# Patient Record
Sex: Female | Born: 1990 | Race: Black or African American | Hispanic: No | Marital: Single | Smoking: Current every day smoker
Health system: Southern US, Community
[De-identification: ages and names within clinical notes are randomized; demographics above are authoritative.]

## PROBLEM LIST (undated history)

## (undated) ENCOUNTER — Inpatient Hospital Stay (HOSPITAL_COMMUNITY): Payer: Medicaid Other

## (undated) ENCOUNTER — Inpatient Hospital Stay (HOSPITAL_COMMUNITY): Payer: Self-pay

## (undated) DIAGNOSIS — B9689 Other specified bacterial agents as the cause of diseases classified elsewhere: Secondary | ICD-10-CM

## (undated) DIAGNOSIS — A749 Chlamydial infection, unspecified: Secondary | ICD-10-CM

## (undated) DIAGNOSIS — J45909 Unspecified asthma, uncomplicated: Secondary | ICD-10-CM

## (undated) DIAGNOSIS — J40 Bronchitis, not specified as acute or chronic: Secondary | ICD-10-CM

## (undated) DIAGNOSIS — N760 Acute vaginitis: Secondary | ICD-10-CM

## (undated) HISTORY — PX: OTHER SURGICAL HISTORY: SHX169

## (undated) HISTORY — PX: WISDOM TOOTH EXTRACTION: SHX21

---
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
## 2012-02-25 HISTORY — PX: WISDOM TOOTH EXTRACTION: SHX21

## 2013-01-03 ENCOUNTER — Encounter (HOSPITAL_COMMUNITY): Payer: Self-pay | Admitting: Emergency Medicine

## 2013-01-03 ENCOUNTER — Emergency Department (INDEPENDENT_AMBULATORY_CARE_PROVIDER_SITE_OTHER)
Admission: EM | Admit: 2013-01-03 | Discharge: 2013-01-03 | Disposition: A | Discharge status: Home / Self Care | Payer: Medicaid Other | Source: Home / Self Care | Attending: Family Medicine | Admitting: Family Medicine

## 2013-01-03 DIAGNOSIS — R590 Localized enlarged lymph nodes: Secondary | ICD-10-CM

## 2013-01-03 DIAGNOSIS — Z331 Pregnant state, incidental: Secondary | ICD-10-CM

## 2013-01-03 DIAGNOSIS — R599 Enlarged lymph nodes, unspecified: Secondary | ICD-10-CM

## 2013-01-03 DIAGNOSIS — Z3490 Encounter for supervision of normal pregnancy, unspecified, unspecified trimester: Secondary | ICD-10-CM

## 2013-01-03 LAB — POCT URINALYSIS DIP (DEVICE)
Bilirubin Urine: NEGATIVE
Glucose, UA: NEGATIVE mg/dL
Hgb urine dipstick: NEGATIVE
Ketones, ur: NEGATIVE mg/dL
Nitrite: NEGATIVE
Protein, ur: NEGATIVE mg/dL
Specific Gravity, Urine: >=1.03 (ref 1.005–1.030)
Urobilinogen, UA: 0.2 mg/dL (ref 0.0–1.0)
pH: 6 (ref 5.0–8.0)

## 2013-01-03 LAB — POCT PREGNANCY, URINE: Preg Test, Ur: POSITIVE — AB

## 2013-01-03 NOTE — ED Notes (Signed)
C/o bladder infection and abscess on vagina Patient states the bladder infection was noticed a couple of days ago No treatment done  Noticed abscess today but feels if she takes antibiotic it will clear

## 2013-01-03 NOTE — ED Provider Notes (Signed)
CSN: [id number]     Arrival date & time 01/03/13  1915 History   First MD Initiated Contact with Patient 01/03/13 2017     Chief Complaint  Patient presents with  . Cystitis  . Abscess   (Consider location/radiation/quality/duration/timing/severity/associated sxs/prior Treatment) Patient is a 22 y.o. female presenting with abscess. The history is provided by the patient.  Abscess Location:  Ano-genital Ano-genital abscess location:  Groin Abscess quality: painful   Abscess quality: not draining, no fluctuance, no induration and no itching   Red streaking: no   Duration:  5 days Pain details:    Quality:  Dull Chronicity:  New Context comment:  Told at clinic last week that she was preg.   History reviewed. No pertinent past medical history. No past surgical history on file. No family history on file. History  Substance Use Topics  . Smoking status: Not on file  . Smokeless tobacco: Not on file  . Alcohol Use: Not on file   OB History   Grav Para Term Preterm Abortions TAB SAB Ect Mult Living                 Review of Systems  Constitutional: Negative.   Genitourinary: Positive for pelvic pain. Negative for dysuria, urgency, frequency and menstrual problem.    Allergies  Review of patient's allergies indicates no known allergies.  Home Medications  No current outpatient prescriptions on file. BP 120/70  Pulse 111  Temp(Src) 99.3 F (37.4 C) (Oral)  Resp 16  SpO2 100%  LMP 12/15/2012 Physical Exam  Nursing note and vitals reviewed. Constitutional: She is oriented to person, place, and time. She appears well-developed and well-nourished.  Abdominal: Soft. Bowel sounds are normal.  Musculoskeletal: She exhibits tenderness.  Left inguinal nodes tender, no skin lesions or abscess.  Neurological: She is alert and oriented to person, place, and time.  Skin: Skin is warm and dry.    ED Course  Procedures (including critical care time) Labs Review Labs  Reviewed  POCT URINALYSIS DIP (DEVICE) - Abnormal; Notable for the following:    Leukocytes, UA TRACE (*)    All other components within normal limits  POCT PREGNANCY, URINE - Abnormal; Notable for the following:    Preg Test, Ur POSITIVE (*)    All other components within normal limits   Imaging Review No results found.  EKG Interpretation     Ventricular Rate:    PR Interval:    QRS Duration:   QT Interval:    QTC Calculation:   R Axis:     Text Interpretation:              MDM  upreg pos.    Linna Hoff, MD 01/04/13 [phone]

## 2013-11-08 ENCOUNTER — Inpatient Hospital Stay (HOSPITAL_COMMUNITY)
Admission: AD | Admit: 2013-11-08 | Discharge: 2013-11-08 | Disposition: A | Discharge status: Home / Self Care | Payer: No Typology Code available for payment source | Source: Ambulatory Visit | Attending: Obstetrics | Admitting: Obstetrics

## 2013-11-08 ENCOUNTER — Encounter (HOSPITAL_COMMUNITY): Payer: Self-pay

## 2013-11-08 DIAGNOSIS — O209 Hemorrhage in early pregnancy, unspecified: Secondary | ICD-10-CM | POA: Diagnosis not present

## 2013-11-08 DIAGNOSIS — O26851 Spotting complicating pregnancy, first trimester: Secondary | ICD-10-CM

## 2013-11-08 DIAGNOSIS — N898 Other specified noninflammatory disorders of vagina: Secondary | ICD-10-CM | POA: Insufficient documentation

## 2013-11-08 DIAGNOSIS — R1031 Right lower quadrant pain: Secondary | ICD-10-CM | POA: Diagnosis not present

## 2013-11-08 DIAGNOSIS — Z87891 Personal history of nicotine dependence: Secondary | ICD-10-CM | POA: Insufficient documentation

## 2013-11-08 DIAGNOSIS — O26891 Other specified pregnancy related conditions, first trimester: Secondary | ICD-10-CM

## 2013-11-08 HISTORY — DX: Unspecified asthma, uncomplicated: J45.909

## 2013-11-08 LAB — OB RESULTS CONSOLE GC/CHLAMYDIA
Chlamydia: NEGATIVE
Gonorrhea: NEGATIVE

## 2013-11-08 LAB — URINALYSIS, ROUTINE W REFLEX MICROSCOPIC
Bilirubin Urine: NEGATIVE
Glucose, UA: 250 mg/dL — AB
Hgb urine dipstick: NEGATIVE
Ketones, ur: NEGATIVE mg/dL
Leukocytes, UA: NEGATIVE
Nitrite: NEGATIVE
Protein, ur: NEGATIVE mg/dL
Specific Gravity, Urine: 1.025 (ref 1.005–1.030)
Urobilinogen, UA: 0.2 mg/dL (ref 0.0–1.0)
pH: 6 (ref 5.0–8.0)

## 2013-11-08 LAB — WET PREP, GENITAL
Trich, Wet Prep: NONE SEEN
WBC, Wet Prep HPF POC: NONE SEEN
Yeast Wet Prep HPF POC: NONE SEEN

## 2013-11-08 MED ORDER — ALBUTEROL SULFATE HFA 108 (90 BASE) MCG/ACT IN AERS: 2.0000 | INHALATION_SPRAY | Freq: Four times a day (QID) | RESPIRATORY_TRACT | Status: DC | PRN

## 2013-11-08 MED ORDER — PROMETHAZINE HCL 12.5 MG PO TABS: 12.5000 mg | ORAL_TABLET | Freq: Four times a day (QID) | ORAL | Status: DC | PRN

## 2013-11-08 NOTE — Discharge Instructions (Signed)
Call the office [phone] to schedule your next prenatal care appointment or if your symptoms worsen. Vaginal Bleeding During Pregnancy, First Trimester A small amount of bleeding (spotting) from the vagina is relatively common in early pregnancy. It usually stops on its own. Various things may cause bleeding or spotting in early pregnancy. Some bleeding may be related to the pregnancy, and some may not. In most cases, the bleeding is normal and is not a problem. However, bleeding can also be a sign of something serious. Be sure to tell your health care provider about any vaginal bleeding right away. Some possible causes of vaginal bleeding during the first trimester include:  Infection or inflammation of the cervix.  Growths (polyps) on the cervix.  Miscarriage or threatened miscarriage.  Pregnancy tissue has developed outside of the uterus and in a fallopian tube (tubal pregnancy).  Tiny cysts have developed in the uterus instead of pregnancy tissue (molar pregnancy). HOME CARE INSTRUCTIONS  Watch your condition for any changes. The following actions may help to lessen any discomfort you are feeling:  Follow your health care provider's instructions for limiting your activity. If your health care provider orders bed rest, you may need to stay in bed and only get up to use the bathroom. However, your health care provider may allow you to continue light activity.  If needed, make plans for someone to help with your regular activities and responsibilities while you are on bed rest.  Keep track of the number of pads you use each day, how often you change pads, and how soaked (saturated) they are. Write this down.  Do not use tampons. Do not douche.  Do not have sexual intercourse or orgasms until approved by your health care provider.  If you pass any tissue from your vagina, save the tissue so you can show it to your health care provider.  Only take over-the-counter or prescription  medicines as directed by your health care provider.  Do not take aspirin because it can make you bleed.  Keep all follow-up appointments as directed by your health care provider. SEEK MEDICAL CARE IF:  You have any vaginal bleeding during any part of your pregnancy.  You have cramps or labor pains.  You have a fever, not controlled by medicine. SEEK IMMEDIATE MEDICAL CARE IF:   You have severe cramps in your back or belly (abdomen).  You pass large clots or tissue from your vagina.  Your bleeding increases.  You feel light-headed or weak, or you have fainting episodes.  You have chills.  You are leaking fluid or have a gush of fluid from your vagina.  You pass out while having a bowel movement. MAKE SURE YOU:  Understand these instructions.  Will watch your condition.  Will get help right away if you are not doing well or get worse. Document Released: 11/20/2004 Document Revised: 02/15/2013 Document Reviewed: 10/18/2012 [facility] Patient Information 2015 [place], [place]. This information is not intended to replace advice given to you by your health care provider. Make sure you discuss any questions you have with your health care provider. Subchorionic Hematoma A subchorionic hematoma is a gathering of blood between the outer wall of the placenta and the inner wall of the womb (uterus). The placenta is the organ that connects the fetus to the wall of the uterus. The placenta performs the feeding, breathing (oxygen to the fetus), and waste removal (excretory work) of the fetus.  Subchorionic hematoma is the most common abnormality found on a result  from ultrasonography done during the first trimester or early second trimester of pregnancy. If there has been little or no vaginal bleeding, early small hematomas usually shrink on their own and do not affect your baby or pregnancy. The blood is gradually absorbed over 1-2 weeks. When bleeding starts later in pregnancy or the hematoma  is larger or occurs in an older pregnant woman, the outcome may not be as good. Larger hematomas may get bigger, which increases the chances for miscarriage. Subchorionic hematoma also increases the risk of premature detachment of the placenta from the uterus, preterm (premature) labor, and stillbirth. HOME CARE INSTRUCTIONS  Stay on bed rest if your health care provider recommends this. Although bed rest will not prevent more bleeding or prevent a miscarriage, your health care provider may recommend bed rest until you are advised otherwise.  Avoid heavy lifting (more than 10 lb [4.5 kg]), exercise, sexual intercourse, or douching as directed by your health care provider.  Keep track of the number of pads you use each day and how soaked (saturated) they are. Write down this information.  Do not use tampons.  Keep all follow-up appointments as directed by your health care provider. Your health care provider may ask you to have follow-up blood tests or ultrasound tests or both. SEEK IMMEDIATE MEDICAL CARE IF:  You have severe cramps in your stomach, back, abdomen, or pelvis.  You have a fever.  You pass large clots or tissue. Save any tissue for your health care provider to look at.  Your bleeding increases or you become lightheaded, feel weak, or have fainting episodes. Document Released: 05/28/2006 Document Revised: 06/27/2013 Document Reviewed: 09/09/2012 [facility] Patient Information 2015 [place], [place]. This information is not intended to replace advice given to you by your health care provider. Make sure you discuss any questions you have with your health care provider.

## 2013-11-08 NOTE — MAU Provider Note (Signed)
History     CSN: [id number]  Arrival date and time: 11/08/13 1812 Provider notified: 1920 Provider on unit: 1940 Provider at bedside: 2000     Chief Complaint  Patient presents with  . Abdominal Pain  . Vaginal Discharge   Abdominal Pain Associated symptoms include nausea.  Vaginal Discharge Associated symptoms include nausea.    [name] is a 23 yo G3P0020 female at 10.[redacted] wks gestation presenting with complaints of post coital vaginal spotting x 3 days. She became concerned this evening, because it continued to go on.  She reports a burning sensation in the RLQ. She has a [facility] that measured 1.6 x 1.4 x 0.4 cm with a posterior placenta by sono at [facility] on 8/17. She has not had her first prenatal visit with [facility]. Her primary OB provider is Dr. Seymour Wagner.  Past Medical History  Diagnosis Date  . Asthma     Past Surgical History  Procedure Laterality Date  . Wisdom tooth extraction  2014  . No past surgeries      No family history on file.  History  Substance Use Topics  . Smoking status: Former Smoker -- 0.25 packs/day for 5 years  . Smokeless tobacco: Not on file  . Alcohol Use: No    Allergies:  Allergies  Allergen Reactions  . Shellfish Allergy Hives and Swelling    Prescriptions prior to admission  Medication Sig Dispense Refill  . Prenatal Vit-Fe Fumarate-FA (PRENATAL MULTIVITAMIN) TABS tablet Take 1 tablet by mouth daily at 12 noon.        Review of Systems  Constitutional: Negative.   HENT: Negative.   Eyes: Negative.   Respiratory:       Chest tightness  Cardiovascular: Negative.   Gastrointestinal: Positive for nausea.       RLQ "burning sensation"  Genitourinary:       Dark brown spotting x 3 days  Musculoskeletal: Negative.   Skin: Negative.   Neurological: Negative.   Endo/Heme/Allergies: Negative.   Psychiatric/Behavioral: Negative.     Results for orders placed during the hospital encounter of 11/08/13 (from the past 24  hour(s))  URINALYSIS, ROUTINE W REFLEX MICROSCOPIC     Status: Abnormal   Collection Time    11/08/13  6:42 PM      Result Value Ref Range   Color, Urine YELLOW  YELLOW   APPearance CLEAR  CLEAR   Specific Gravity, Urine 1.025  1.005 - 1.030   pH 6.0  5.0 - 8.0   Glucose, UA 250 (*) NEGATIVE mg/dL   Hgb urine dipstick NEGATIVE  NEGATIVE   Bilirubin Urine NEGATIVE  NEGATIVE   Ketones, ur NEGATIVE  NEGATIVE mg/dL   Protein, ur NEGATIVE  NEGATIVE mg/dL   Urobilinogen, UA 0.2  0.0 - 1.0 mg/dL   Nitrite NEGATIVE  NEGATIVE   Leukocytes, UA NEGATIVE  NEGATIVE  WET PREP, GENITAL     Status: Abnormal   Collection Time    11/08/13  8:05 PM      Result Value Ref Range   Yeast Wet Prep HPF POC NONE SEEN  NONE SEEN   Trich, Wet Prep NONE SEEN  NONE SEEN   Clue Cells Wet Prep HPF POC FEW (*) NONE SEEN   WBC, Wet Prep HPF POC NONE SEEN  NONE SEEN   FHTs: 167 bpm (doppler)  Physical Exam   Blood pressure 132/83, pulse 68, temperature 98.7 F (37.1 C), temperature source Oral, resp. rate 16, height  (1.6 m), weight  60.691 kg (133 lb 12.8 oz), last menstrual period 12/15/2012, SpO2 98.00%, SpO2 #2 99% on RA  Physical Exam  Constitutional: She is oriented to person, place, and time. She appears well-developed and well-nourished.  HENT:  Head: Normocephalic and atraumatic.  Eyes: Pupils are equal, round, and reactive to light.  Neck: Normal range of motion. Neck supple.  Cardiovascular: Normal rate, regular rhythm and normal heart sounds.   Respiratory: Effort normal and breath sounds normal.  GI: Soft. Bowel sounds are normal.  Genitourinary: Vaginal discharge found.  Small amount of white vaginal d/c; no evidence of blood in vagina; wet prep done; S=D; no CMT  Musculoskeletal: Normal range of motion.  Neurological: She is alert and oriented to person, place, and time.  Skin: Skin is warm and dry.  tattoos  Psychiatric: She has a normal mood and affect. Her behavior is normal.  Judgment and thought content normal.    MAU Course  Procedures SSE Wet Prep GC/CT - pending Assessment and Plan  22 yo G3P0020 @ 10.[redacted] wks gestation Bleeding during pregnancy, first trimester  Discharge home Call to schedule appointment ASAP Call the office for any worsening of sx's  Briana Wagner, M MSN, CNM 11/08/2013, 8:11 PM

## 2013-11-08 NOTE — MAU Note (Signed)
Patient states she has had abdominal pain and spotting for about 3 days after intercourse. Vomited x 1 yesterday. Slight nausea, has med at home.

## 2013-11-09 ENCOUNTER — Encounter (HOSPITAL_COMMUNITY): Payer: Self-pay | Admitting: *Deleted

## 2013-11-09 ENCOUNTER — Inpatient Hospital Stay (HOSPITAL_COMMUNITY)
Admission: AD | Admit: 2013-11-09 | Discharge: 2013-11-09 | Disposition: A | Discharge status: Home / Self Care | Payer: No Typology Code available for payment source | Source: Ambulatory Visit | Attending: Obstetrics | Admitting: Obstetrics

## 2013-11-09 DIAGNOSIS — O360990 Maternal care for other rhesus isoimmunization, unspecified trimester, not applicable or unspecified: Secondary | ICD-10-CM | POA: Insufficient documentation

## 2013-11-09 DIAGNOSIS — O468X1 Other antepartum hemorrhage, first trimester: Secondary | ICD-10-CM

## 2013-11-09 DIAGNOSIS — Z87891 Personal history of nicotine dependence: Secondary | ICD-10-CM | POA: Insufficient documentation

## 2013-11-09 DIAGNOSIS — O208 Other hemorrhage in early pregnancy: Secondary | ICD-10-CM | POA: Diagnosis present

## 2013-11-09 DIAGNOSIS — O418X10 Other specified disorders of amniotic fluid and membranes, first trimester, not applicable or unspecified: Secondary | ICD-10-CM | POA: Diagnosis present

## 2013-11-09 LAB — GC/CHLAMYDIA PROBE AMP
CT Probe RNA: NEGATIVE
GC Probe RNA: NEGATIVE

## 2013-11-09 NOTE — Discharge Instructions (Signed)
Vaginal Bleeding During Pregnancy, First Trimester  A small amount of bleeding (spotting) from the vagina is relatively common in early pregnancy. It usually stops on its own. Various things may cause bleeding or spotting in early pregnancy. Some bleeding may be related to the pregnancy, and some may not. In most cases, the bleeding is normal and is not a problem. However, bleeding can also be a sign of something serious. Be sure to tell your health care provider about any vaginal bleeding right away.  Some possible causes of vaginal bleeding during the first trimester include:  · Infection or inflammation of the cervix.  · Growths (polyps) on the cervix.  · Miscarriage or threatened miscarriage.  · Pregnancy tissue has developed outside of the uterus and in a fallopian tube (tubal pregnancy).  · Tiny cysts have developed in the uterus instead of pregnancy tissue (molar pregnancy).  HOME CARE INSTRUCTIONS   Watch your condition for any changes. The following actions may help to lessen any discomfort you are feeling:  · Follow your health care provider's instructions for limiting your activity. If your health care provider orders bed rest, you may need to stay in bed and only get up to use the bathroom. However, your health care provider may allow you to continue light activity.  · If needed, make plans for someone to help with your regular activities and responsibilities while you are on bed rest.  · Keep track of the number of pads you use each day, how often you change pads, and how soaked (saturated) they are. Write this down.  · Do not use tampons. Do not douche.  · Do not have sexual intercourse or orgasms until approved by your health care provider.  · If you pass any tissue from your vagina, save the tissue so you can show it to your health care provider.  · Only take over-the-counter or prescription medicines as directed by your health care provider.  · Do not take aspirin because it can make you  bleed.  · Keep all follow-up appointments as directed by your health care provider.  SEEK MEDICAL CARE IF:  · You have any vaginal bleeding during any part of your pregnancy.  · You have cramps or labor pains.  · You have a fever, not controlled by medicine.  SEEK IMMEDIATE MEDICAL CARE IF:   · You have severe cramps in your back or belly (abdomen).  · You pass large clots or tissue from your vagina.  · Your bleeding increases.  · You feel light-headed or weak, or you have fainting episodes.  · You have chills.  · You are leaking fluid or have a gush of fluid from your vagina.  · You pass out while having a bowel movement.  MAKE SURE YOU:  · Understand these instructions.  · Will watch your condition.  · Will get help right away if you are not doing well or get worse.  Document Released: 11/20/2004 Document Revised: 02/15/2013 Document Reviewed: 10/18/2012  [employer]® Patient Information ©2015 [employer]. This information is not intended to replace advice given to you by your health care provider. Make sure you discuss any questions you have with your health care provider.

## 2013-11-09 NOTE — MAU Note (Signed)
Pt states that she would like more information on her dx.  Pt's only complaint is a slight burning sensation in her lower abdomen.

## 2013-11-09 NOTE — MAU Note (Signed)
Not having pain, not having any bleeding. Pt's father had called in, did not understand why she was d/c'd with a blood clot. (subchorionic hemorrhage); unable to discuss, did not call primary.  States is having a little burning.

## 2013-11-09 NOTE — MAU Provider Note (Signed)
  History     CSN: [id number]  Arrival date and time: 11/09/13 1230 Provider notified of pt arrival  Provider at bedside    Chief Complaint  Patient presents with  . pt had questions regarding dx    HPI Comments: G3P0020  by early sono presents with questions regarding previous dx of [facility]. States she "wants more information about what it is". Was seen last night in MAU for postcoital bleeding. No VB or pain today. [facility] seen on sono at [redacted]w[redacted]d measuring 1.6x1.4x0.4 cm. Planning to initiate [facility] with Dr. Seymour Bars, but has current financial barriers.    OB History   Grav Para Term Preterm Abortions TAB SAB Ect Mult Living   3    2           Past Medical History  Diagnosis Date  . Asthma     Past Surgical History  Procedure Laterality Date  . Wisdom tooth extraction  2014  . No past surgeries      History reviewed. No pertinent family history.  History  Substance Use Topics  . Smoking status: Former Smoker -- 0.25 packs/day for 5 years  . Smokeless tobacco: Not on file  . Alcohol Use: No    Allergies:  Allergies  Allergen Reactions  . Shellfish Allergy Hives and Swelling    Prescriptions prior to admission  Medication Sig Dispense Refill  . Prenatal Vit-Fe Fumarate-FA (PRENATAL MULTIVITAMIN) TABS tablet Take 1 tablet by mouth daily at 12 noon.      Marland Kitchen albuterol (PROVENTIL HFA;VENTOLIN HFA) 108 (90 BASE) MCG/ACT inhaler Inhale 2 puffs into the lungs every 6 (six) hours as needed for wheezing or shortness of breath.  1 Inhaler  2  . promethazine (PHENERGAN) 12.5 MG tablet Take 1 tablet (12.5 mg total) by mouth every 6 (six) hours as needed for nausea or vomiting.  30 tablet  0    Review of Systems  Constitutional: Negative.   HENT: Negative.   Eyes: Negative.   Respiratory: Negative.   Cardiovascular: Negative.   Gastrointestinal: Negative.   Genitourinary: Negative.   Musculoskeletal: Negative.   Skin: Negative.   Neurological: Negative.    Endo/Heme/Allergies: Negative.   Psychiatric/Behavioral: Negative.    Physical Exam   Blood pressure 120/71, pulse 81, temperature 98.6 F (37 C), temperature source Oral, resp. rate 16, last menstrual period 12/15/2012.  Physical Exam  Constitutional: She is oriented to person, place, and time. She appears well-developed and well-nourished.  HENT:  Head: Normocephalic.  Neck: Normal range of motion.  Cardiovascular: Normal rate.   Respiratory: Effort normal.  Musculoskeletal: Normal range of motion.  Neurological: She is alert and oriented to person, place, and time.  Skin: Skin is warm and dry.  Psychiatric: She has a normal mood and affect.  FHR: 165 bpm  MAU Course  Procedures  Extensive discussion regarding SCH-risks, course of care, warning signs.  Assessment and Plan  [redacted] weeks gestation Subchorionic hemorrhage Rh positive  Discharge home SAB precautions Bleeding precautions Follow-up in office w/Dr. Steffanie Rainwater appointments to begin prenatal care asap -discussed importance d/t of timing of prenatal testing/eval Continue PNV Call office for questions/concerns- after hrs contact info discussed    Briana Wagner, Briana Wagner, Briana Wagner 11/09/2013, 1:27 PM

## 2013-11-10 NOTE — MAU Provider Note (Signed)
WOB chart reviewed. Rh positive

## 2013-12-26 ENCOUNTER — Encounter (HOSPITAL_COMMUNITY): Payer: Self-pay | Admitting: *Deleted

## 2014-01-03 LAB — OB RESULTS CONSOLE RUBELLA ANTIBODY, IGM: Rubella: IMMUNE

## 2014-01-03 LAB — OB RESULTS CONSOLE RPR: RPR: NONREACTIVE

## 2014-01-03 LAB — OB RESULTS CONSOLE HEPATITIS B SURFACE ANTIGEN: Hepatitis B Surface Ag: NEGATIVE

## 2014-01-03 LAB — OB RESULTS CONSOLE HIV ANTIBODY (ROUTINE TESTING): HIV: NONREACTIVE

## 2014-02-03 ENCOUNTER — Emergency Department (HOSPITAL_COMMUNITY)
Admission: EM | Admit: 2014-02-03 | Discharge: 2014-02-03 | Disposition: A | Discharge status: Home / Self Care | Payer: No Typology Code available for payment source | Attending: Emergency Medicine | Admitting: Emergency Medicine

## 2014-02-03 ENCOUNTER — Encounter (HOSPITAL_COMMUNITY): Payer: Self-pay | Admitting: Emergency Medicine

## 2014-02-03 DIAGNOSIS — Y92410 Unspecified street and highway as the place of occurrence of the external cause: Secondary | ICD-10-CM | POA: Diagnosis not present

## 2014-02-03 DIAGNOSIS — Z3490 Encounter for supervision of normal pregnancy, unspecified, unspecified trimester: Secondary | ICD-10-CM

## 2014-02-03 DIAGNOSIS — Z87891 Personal history of nicotine dependence: Secondary | ICD-10-CM | POA: Insufficient documentation

## 2014-02-03 DIAGNOSIS — Y9389 Activity, other specified: Secondary | ICD-10-CM | POA: Diagnosis not present

## 2014-02-03 DIAGNOSIS — O9A212 Injury, poisoning and certain other consequences of external causes complicating pregnancy, second trimester: Secondary | ICD-10-CM | POA: Insufficient documentation

## 2014-02-03 DIAGNOSIS — Z3A20 20 weeks gestation of pregnancy: Secondary | ICD-10-CM | POA: Diagnosis not present

## 2014-02-03 DIAGNOSIS — J45909 Unspecified asthma, uncomplicated: Secondary | ICD-10-CM | POA: Diagnosis not present

## 2014-02-03 DIAGNOSIS — Y998 Other external cause status: Secondary | ICD-10-CM | POA: Insufficient documentation

## 2014-02-03 DIAGNOSIS — O99512 Diseases of the respiratory system complicating pregnancy, second trimester: Secondary | ICD-10-CM | POA: Insufficient documentation

## 2014-02-03 DIAGNOSIS — S3992XA Unspecified injury of lower back, initial encounter: Secondary | ICD-10-CM | POA: Insufficient documentation

## 2014-02-03 DIAGNOSIS — Z79899 Other long term (current) drug therapy: Secondary | ICD-10-CM | POA: Diagnosis not present

## 2014-02-03 DIAGNOSIS — S199XXA Unspecified injury of neck, initial encounter: Secondary | ICD-10-CM | POA: Insufficient documentation

## 2014-02-03 MED ORDER — HYDROCODONE-ACETAMINOPHEN 5-325 MG PO TABS: 1.0000 | ORAL_TABLET | Freq: Four times a day (QID) | ORAL | Status: DC | PRN
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
## 2014-02-03 NOTE — Discharge Instructions (Signed)
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
## 2014-02-24 NOTE — L&D Delivery Note (Signed)
Delivery Note At 4:25 PM a viable unspecified sex was delivered via Vaginal, Spontaneous Delivery (Presentation: Right Occiput Posterior).  APGAR: , ; weight  .   Placenta status: Intact, Pathology Spontaneous.  Cord: 3 vessels with the following complications: None.   Anesthesia: Epidural  Episiotomy: None Lacerations: 1st degree;Perineal Suture Repair: 2.0 vicryl Est. Blood Loss (mL): 250  Mom to postpartum.  Baby to Couplet care / Skin to Skin.  Briana Wagner A 04/14/2014, 4:35 PM

## 2014-04-02 ENCOUNTER — Encounter (HOSPITAL_COMMUNITY): Payer: Self-pay | Admitting: *Deleted

## 2014-04-02 ENCOUNTER — Inpatient Hospital Stay (HOSPITAL_COMMUNITY): Payer: Medicaid Other

## 2014-04-02 ENCOUNTER — Inpatient Hospital Stay (HOSPITAL_COMMUNITY)
Admission: AD | Admit: 2014-04-02 | Discharge: 2014-04-07 | DRG: 782 | Disposition: A | Discharge status: Home / Self Care | Payer: Medicaid Other | Source: Ambulatory Visit | Attending: Obstetrics | Admitting: Obstetrics

## 2014-04-02 DIAGNOSIS — O4690 Antepartum hemorrhage, unspecified, unspecified trimester: Secondary | ICD-10-CM | POA: Diagnosis present

## 2014-04-02 DIAGNOSIS — Z3A31 31 weeks gestation of pregnancy: Secondary | ICD-10-CM | POA: Diagnosis present

## 2014-04-02 DIAGNOSIS — Z87891 Personal history of nicotine dependence: Secondary | ICD-10-CM | POA: Diagnosis not present

## 2014-04-02 DIAGNOSIS — Z8249 Family history of ischemic heart disease and other diseases of the circulatory system: Secondary | ICD-10-CM

## 2014-04-02 DIAGNOSIS — O4693 Antepartum hemorrhage, unspecified, third trimester: Principal | ICD-10-CM | POA: Diagnosis present

## 2014-04-02 DIAGNOSIS — O4590 Premature separation of placenta, unspecified, unspecified trimester: Secondary | ICD-10-CM | POA: Diagnosis present

## 2014-04-02 HISTORY — DX: Bronchitis, not specified as acute or chronic: J40

## 2014-04-02 LAB — URINE MICROSCOPIC-ADD ON

## 2014-04-02 LAB — CBC
HCT: 35.7 % — ABNORMAL LOW (ref 36.0–46.0)
Hemoglobin: 12.6 g/dL (ref 12.0–15.0)
MCH: 32.1 pg (ref 26.0–34.0)
MCHC: 35.3 g/dL (ref 30.0–36.0)
MCV: 91.1 fL (ref 78.0–100.0)
Platelets: 147 10*3/uL — ABNORMAL LOW (ref 150–400)
RBC: 3.92 MIL/uL (ref 3.87–5.11)
RDW: 12.9 % (ref 11.5–15.5)
WBC: 8.4 10*3/uL (ref 4.0–10.5)

## 2014-04-02 LAB — URINALYSIS, ROUTINE W REFLEX MICROSCOPIC
Bilirubin Urine: NEGATIVE
Glucose, UA: NEGATIVE mg/dL
Ketones, ur: NEGATIVE mg/dL
Nitrite: NEGATIVE
Protein, ur: NEGATIVE mg/dL
Specific Gravity, Urine: 1.015 (ref 1.005–1.030)
Urobilinogen, UA: 0.2 mg/dL (ref 0.0–1.0)
pH: 6 (ref 5.0–8.0)

## 2014-04-02 LAB — PROTIME-INR
INR: 0.98 (ref 0.00–1.49)
Prothrombin Time: 13.1 seconds (ref 11.6–15.2)

## 2014-04-02 LAB — FIBRINOGEN: Fibrinogen: 488 mg/dL — ABNORMAL HIGH (ref 204–475)

## 2014-04-02 LAB — ABO/RH: ABO/RH(D): O POS

## 2014-04-02 LAB — TYPE AND SCREEN
ABO/RH(D): O POS
Antibody Screen: NEGATIVE

## 2014-04-02 LAB — APTT: aPTT: 27 seconds (ref 24–37)

## 2014-04-02 MED ORDER — OXYCODONE-ACETAMINOPHEN 5-325 MG PO TABS
1.0000 | ORAL_TABLET | Freq: Once | ORAL | Status: AC
Start: 2014-04-02 — End: 2014-04-02
  Administered 2014-04-02: 1 via ORAL
  Filled 2014-04-02: qty 1

## 2014-04-02 MED ORDER — PROMETHAZINE HCL 25 MG PO TABS
25.0000 mg | ORAL_TABLET | Freq: Once | ORAL | Status: AC
  Administered 2014-04-02: 25 mg via ORAL
  Filled 2014-04-02: qty 1

## 2014-04-02 MED ORDER — ZOLPIDEM TARTRATE 5 MG PO TABS
5.0000 mg | ORAL_TABLET | Freq: Every evening | ORAL | Status: DC | PRN
Start: 2014-04-02 — End: 2014-04-07
  Administered 2014-04-02 – 2014-04-06 (×5): 5 mg via ORAL
  Filled 2014-04-02 (×5): qty 1

## 2014-04-02 MED ORDER — LACTATED RINGERS IV SOLN
INTRAVENOUS | Status: DC
  Administered 2014-04-02 (×2): via INTRAVENOUS
  Administered 2014-04-02: 125 mL/h via INTRAVENOUS
  Administered 2014-04-02: 18:00:00 via INTRAVENOUS
  Administered 2014-04-03: 125 mL/h via INTRAVENOUS
  Administered 2014-04-03: 03:00:00 via INTRAVENOUS

## 2014-04-02 MED ORDER — BETAMETHASONE SOD PHOS & ACET 6 (3-3) MG/ML IJ SUSP
12.0000 mg | INTRAMUSCULAR | Status: AC
Start: 2014-04-02 — End: 2014-04-03
  Administered 2014-04-02 – 2014-04-03 (×2): 12 mg via INTRAMUSCULAR
  Filled 2014-04-02 (×2): qty 2

## 2014-04-02 NOTE — Progress Notes (Signed)
[facility] OK to bring pt to [facility]

## 2014-04-02 NOTE — MAU Provider Note (Signed)
History     CSN: [id number]  Arrival date and time: 04/02/14 0159   First Provider Initiated Contact with Patient 04/02/14 0242      No chief complaint on file.  HPI Comments: Briana Wagner 24 y.o. G2P0010 [redacted]w[redacted]d presents to MAU with vaginal bleeding since last night. She has been passing large clots as well. She states they have been as large as grapefruit. She has mild cramping and would like something for pain. She has not had sexual intercourse in the last 24 hours. She was seen in office last week and was having " brown blood" at that time. She was told this was normal.      Past Medical History  Diagnosis Date  . Asthma   . Bronchitis     Past Surgical History  Procedure Laterality Date  . Wisdom tooth extraction  2014  . No past surgeries    . Wisdom tooth extraction      Family History  Problem Relation Age of Onset  . Hypertension Mother   . Heart disease Father   . Aneurysm Mother     History  Substance Use Topics  . Smoking status: Former Smoker -- 0.25 packs/day for 5 years  . Smokeless tobacco: Not on file  . Alcohol Use: No    Allergies:  Allergies  Allergen Reactions  . Shellfish Allergy Hives and Swelling    Prescriptions prior to admission  Medication Sig Dispense Refill Last Dose  . Prenatal Vit-Fe Fumarate-FA (PRENATAL MULTIVITAMIN) TABS tablet Take 1 tablet by mouth daily at 12 noon.   04/01/2014 at Unknown time  . albuterol (PROVENTIL HFA;VENTOLIN HFA) 108 (90 BASE) MCG/ACT inhaler Inhale 2 puffs into the lungs every 6 (six) hours as needed for wheezing or shortness of breath. 1 Inhaler 2 unk  . HYDROcodone-acetaminophen (NORCO/VICODIN) 5-325 MG per tablet Take 1 tablet by mouth every 6 (six) hours as needed. 6 tablet 0   . promethazine (PHENERGAN) 12.5 MG tablet Take 1 tablet (12.5 mg total) by mouth every 6 (six) hours as needed for nausea or vomiting. 30 tablet 0 unk    Review of Systems  Constitutional: Negative.   HENT: Negative.    Eyes: Negative.   Respiratory: Negative.   Cardiovascular: Negative.   Gastrointestinal: Negative.   Genitourinary:       Vaginal bleeding and cramping  Musculoskeletal: Negative.   Skin: Negative.   Neurological: Negative.   Psychiatric/Behavioral: Negative.    Physical Exam   Blood pressure 125/69, pulse 78, temperature 98.9 F (37.2 C), height  (1.6 m), weight 73.936 kg (163 lb), last menstrual period 12/15/2012.  Physical Exam  Constitutional: She is oriented to person, place, and time. She appears well-developed and well-nourished. No distress.  HENT:  Head: Normocephalic and atraumatic.  GI: Soft. She exhibits no distension. There is tenderness.  Genitourinary:  Genital:External bloody Vaginal: small amount blood in vaginal Biman exam not done   Musculoskeletal: Normal range of motion.  Neurological: She is alert and oriented to person, place, and time.  Skin: Skin is warm.  Psychiatric: She has a normal mood and affect. Her behavior is normal. Judgment and thought content normal.   Results for orders placed or performed during the hospital encounter of 04/02/14 (from the past 24 hour(s))  Urinalysis, Routine w reflex microscopic     Status: Abnormal   Collection Time: 04/02/14  2:12 AM  Result Value Ref Range   Color, Urine YELLOW YELLOW   APPearance CLEAR CLEAR  Specific Gravity, Urine 1.015 1.005 - 1.030   pH 6.0 5.0 - 8.0   Glucose, UA NEGATIVE NEGATIVE mg/dL   Hgb urine dipstick LARGE (A) NEGATIVE   Bilirubin Urine NEGATIVE NEGATIVE   Ketones, ur NEGATIVE NEGATIVE mg/dL   Protein, ur NEGATIVE NEGATIVE mg/dL   Urobilinogen, UA 0.2 0.0 - 1.0 mg/dL   Nitrite NEGATIVE NEGATIVE   Leukocytes, UA TRACE (A) NEGATIVE  Urine microscopic-add on     Status: Abnormal   Collection Time: 04/02/14  2:12 AM  Result Value Ref Range   Squamous Epithelial / LPF FEW (A) RARE   WBC, UA 0-2 <3 WBC/hpf   RBC / HPF 11-20 <3 RBC/hpf   Bacteria, UA FEW (A) RARE  CBC      Status: Abnormal   Collection Time: 04/02/14  4:10 AM  Result Value Ref Range   WBC 8.4 4.0 - 10.5 K/uL   RBC 3.92 3.87 - 5.11 MIL/uL   Hemoglobin 12.6 12.0 - 15.0 g/dL   HCT 45.435.7 (L) 09.836.0 - 11.946.0 %   MCV 91.1 78.0 - 100.0 fL   MCH 32.1 26.0 - 34.0 pg   MCHC 35.3 30.0 - 36.0 g/dL   RDW 14.712.9 82.911.5 - 56.215.5 %   Platelets 147 (L) 150 - 400 K/uL  APTT     Status: None   Collection Time: 04/02/14  4:10 AM  Result Value Ref Range   aPTT 27 24 - 37 seconds   . MAU Course  Procedures  MDM Percocet/ phenergan OB ultrasound/ no evidence abruption Dr Gaynell FaceMarshall will see patient  Ultrasound shows   Assessment and Plan  A: Vaginal bleeding in pregnancy P: Dr Gaynell FaceMarshall will admit   Carolynn ServeBarefoot, Larenda Reedy Miller 04/02/2014, 4:43 AM

## 2014-04-02 NOTE — Progress Notes (Signed)
US adjusted, fhr not tracing well. ? decel but pt turned to side, sitting hight fowlers eating dinner. Appears to not be tracing fhr accurately. When adjusted, fhr 135. Pt states she will eat in a position where fhr can trace more consistently.

## 2014-04-02 NOTE — H&P (Signed)
This is Dr. Francoise CeoBernard Marshall dictating the history and physical on  Briana Wagner  she's a [age] year old gravida 2 para 0010 [facility] 2031 [facility] the patient states that on Friday night she had some painless vaginal bleeding and did not have sex  bleeding stopped on its  Own  tonight at 1 AM she went to the bathroom and saw clot and some  Blood    and came to the hospital she denied any pain ultrasound done to here tonight was normal no sign of a previa abruption her cervix is closed bleeding stop and her uterus was soft was decided patient be admitted for betamethasone 12 mg IM now and in 24 hours continuous monitoring and MFM consult she denied having sex   Past medical history negative Past surgical history negative Social history negative System review negative Physical exam well-developed female in no distress HEENT negative Lungs clear to P&A Heart regular rhythm no murmurs no gallops Breasts negative Abdomen 30 week size soft normal fetal heart and the cervix is closed extremities negative

## 2014-04-02 NOTE — Plan of Care (Signed)
Problem: Consults Goal: Birthing Suites Patient Information Press F2 to bring up selections list Outcome: Completed/Met Date Met:  04/02/14  Pt < [redacted] weeks EGA and Antenatal Patient (< 37 weeks)

## 2014-04-02 NOTE — Progress Notes (Signed)
UR completed 

## 2014-04-02 NOTE — Progress Notes (Signed)
EFM strip reviewed

## 2014-04-02 NOTE — Progress Notes (Signed)
Dr Vincenza HewsQuinn updated pt here at [facility]. Dr Gaynell FaceMarshall consulted-pt to get consult Monday here. Dr Gaynell FaceMarshall notified pt doing well, scant brown bleeding-pt may eat regular diet at lunch if she continues to do well.

## 2014-04-02 NOTE — Progress Notes (Signed)
1752 dr Gaynell FaceMarshall returned page-updated on fhr vbls noted 1 this am, 1at 1430, 1 at 1730 down to 60 x 50 seconds. Interventions including position change, iv bolus, pulse ox placed on mom. Also ? 4-5 minute decel x 2 today but could have been tracing maternal due to pt had been moving and sitting up eating and also fhr was not continuous at both times. No new orders given-to continue monitoring. Updated bleeding scant and brown.

## 2014-04-02 NOTE — Progress Notes (Signed)
Dr Gaynell FaceMarshall in to see pt

## 2014-04-02 NOTE — Progress Notes (Signed)
To us via wc.

## 2014-04-02 NOTE — MAU Note (Addendum)
Pt states had kiwi sized clot last night and then this morning grapefruit sized clot. Mild cramping. No intercourse last 24hrs. Good FM. States does not have bleeding inbetween times of passing clots. Awoke this morning and felt like had to go to BR and then passed large clot

## 2014-04-03 ENCOUNTER — Ambulatory Visit (HOSPITAL_COMMUNITY): Payer: Medicaid Other

## 2014-04-03 LAB — OB RESULTS CONSOLE GBS: GBS: POSITIVE
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
## 2014-04-03 NOTE — Progress Notes (Signed)
Patient ID: Briana Wagner, female   DOB: [birth year], 24 y.o.   MRN: [id number] Vital signs normal Since admission she has not had any bleeding or contractions She is to get her second Otha betamethasone this a.m. and the then the seen by maternal-fetal medicine

## 2014-04-03 NOTE — Plan of Care (Signed)
Problem: Phase I Progression Outcomes Goal: No significant worsening bleeding/cervix change/vag drainage No significant worsening in vaginal bleeding, cervical change, or vaginal drainage.  Outcome: Progressing Pt having small amounts of brown blood with pad change.  Dr. Gaynell FaceMarshall aware. No plans for ultrasound today.

## 2014-04-03 NOTE — Consult Note (Signed)
Maternal Fetal Medicine Consultation  Requesting Provider(s): Francoise Ceo, MD  Reason for consultation: Third trimester vaginal bleeding  HPI: Briana Wagner is a 24 yo G2P0010 currently at 31w 5d seen for consultation due to third trimester vaginal bleeding.  Briana Wagner reports that she experienced heavy, bright red vaginal bleeding yesterday - reports passing "grapefruit-sized clots" and bleeding more than enough to saturate a pad.  Since admission, the bleeding has improved - mostly brownish discharge, but does not some blood after using the restroom.  She initially had some cramping that has since improved.  The fetus is active.  She denies uterine contractions or vaginal bleeding.  Briana Wagner completed her second dose of betamethasone this morning.  Her prenatal course has otherwise been unremarkable.  Admission fibrinogen was 488.  Coags were normal.  Blood type O+.  Limited ultrasound performed at the time of admission did not show previa or ultrasound evidence of abruption.  OB History: OB History    Gravida Para Term Preterm AB TAB SAB Ectopic Multiple Living   2    1     0      PMH:  Past Medical History  Diagnosis Date  . Asthma   . Bronchitis     PSH:  Past Surgical History  Procedure Laterality Date  . Wisdom tooth extraction  2014  . No past surgeries    . Wisdom tooth extraction     Meds:  Scheduled Meds:  Continuous Infusions: . lactated ringers Stopped (04/03/14 1129)   PRN Meds:.zolpidem   Allergies:  Allergies  Allergen Reactions  . Shellfish Allergy Hives and Swelling   FH:  Family History  Problem Relation Age of Onset  . Hypertension Mother   . Heart disease Father   . Aneurysm Mother    Soc:  History   Social History  . Marital Status: Single    Spouse Name: N/A    Number of Children: N/A  . Years of Education: N/A   Occupational History  . Not on file.   Social History Main Topics  . Smoking status: Former Smoker -- 0.25  packs/day for 5 years    Quit date: 09/30/2013  . Smokeless tobacco: Not on file  . Alcohol Use: No  . Drug Use: Yes    Special: Marijuana     Comment: last use 1 month   . Sexual Activity: Yes   Other Topics Concern  . Not on file   Social History Narrative    Review of Systems: no vaginal bleeding or cramping/contractions, no LOF, no nausea/vomiting. All other systems reviewed and are negative.  PNL:   PE:   Filed Vitals:   04/03/14 1131  BP: 111/61  Pulse: 71  Temp:   Resp:     Labs: CBC    Component Value Date/Time   WBC 8.4 04/02/2014 0410   RBC 3.92 04/02/2014 0410   HGB 12.6 04/02/2014 0410   HCT 35.7* 04/02/2014 0410   PLT 147* 04/02/2014 0410   MCV 91.1 04/02/2014 0410   MCH 32.1 04/02/2014 0410   MCHC 35.3 04/02/2014 0410   RDW 12.9 04/02/2014 0410     A/P: 1) Single IUP at 31w 5d         2) Third trimester vaginal bleeding - normal fibrinogen, normal coags - no evidence of abruption or previa by admission ultrasound.  Currently stable - without active bleeding currently  Recommendations: 1) Recommend ultrasound for EFW while hospitalized.  Given that this is her  second episode of vaginal bleeding, would also recommend serial growth scans every 3-4 weeks and antenatal testing (2x weekly NSTs with weekly AFIs) after hospital discharge. 2) Would continue in-house observation for now - would observe in house for at least 72 hours without any active vaginal bleeding prior to hospital discharge.   3) At least daily or more frequent NSTs as clinically appropriate while hospitalized.  Recommendations discussed with Dr. Gaynell FaceMarshall   Thank you for the opportunity to be a part of the care of Loletha GrayerMelanie D Fooks. Please contact our office if we can be of further assistance.   I spent approximately 30 minutes with this patient with over 50% of time spent in face-to-face counseling.  Alpha GulaPaul Lamonica Trueba, MD Maternal Fetal Medicine

## 2014-04-04 MED ORDER — PRENATAL MULTIVITAMIN CH
1.0000 | ORAL_TABLET | Freq: Every day | ORAL | Status: DC
  Administered 2014-04-04 – 2014-04-06 (×3): 1 via ORAL
  Filled 2014-04-04 (×3): qty 1

## 2014-04-04 MED ORDER — DOCUSATE SODIUM 100 MG PO CAPS
100.0000 mg | ORAL_CAPSULE | Freq: Two times a day (BID) | ORAL | Status: DC
  Administered 2014-04-04 – 2014-04-06 (×6): 100 mg via ORAL
  Filled 2014-04-04 (×5): qty 1

## 2014-04-04 MED ORDER — DOCUSATE SODIUM 50 MG/5ML PO LIQD
100.0000 mg | Freq: Two times a day (BID) | ORAL | Status: DC
  Filled 2014-04-04 (×3): qty 10

## 2014-04-04 MED ORDER — INFLUENZA VAC SPLIT QUAD 0.5 ML IM SUSY
0.5000 mL | PREFILLED_SYRINGE | INTRAMUSCULAR | Status: AC
  Administered 2014-04-05: 0.5 mL via INTRAMUSCULAR
  Filled 2014-04-04 (×2): qty 0.5

## 2014-04-04 NOTE — Progress Notes (Signed)
Patient ID: Loletha GrayerMelanie D Edgerly, female   DOB: March 26, 1990, 24 y.o.   MRN: [id number] Vital signs normal No bleeding or cramping today Patient says she had a small amount of bright red bleeding yesterday she was seen by MFM and and the impression is a marginal abruption and so she will be kept on bedrest and discharge  After  5 days from the hospital

## 2014-04-05 DIAGNOSIS — O4590 Premature separation of placenta, unspecified, unspecified trimester: Secondary | ICD-10-CM | POA: Diagnosis present

## 2014-04-05 NOTE — Progress Notes (Signed)
Ur chart review completed.  

## 2014-04-05 NOTE — Progress Notes (Signed)
Patient ID: Briana Wagner, female   DOB: 12/27/1990, 24 y.o.   MRN: [id number] Vital signs normal Reactive tracing She has not had any more bleeding or contractions

## 2014-04-06 NOTE — Progress Notes (Signed)
Pt c/o breasts leaking milk- breast pads given to pt.

## 2014-04-06 NOTE — Progress Notes (Signed)
Patient ID: Briana Wagner, female   DOB: 22-May-1990, 22 y.o.   MRN: [id number] Vital signs normal No bleeding No cramping home tomorrow

## 2014-04-07 NOTE — Discharge Instructions (Signed)
Discharge instructions   You can wash your hair  Shower  Eat what you want  Drink what you want  See me in 6 weeks  Your ankles are going to swell more in the next 2 weeks than when pregnant  No sex for 6 weeks   Hassen Bruun A, MD 04/07/2014

## 2014-04-07 NOTE — Progress Notes (Signed)
Pt discharged with family and friends.

## 2014-04-07 NOTE — Discharge Summary (Signed)
Patient's a 24 year old gravida 2 para 0010 at 2532 weeks 2 days EDC 05/31/2014 she was admitted on Sunday because of painless vaginal bleeding ultrasound was normal she was seen by MFM suggested possible marginal abruption and recommend that she stay nonhospital 5 days since she's been here does been one episode of a small amount of bleeding none in the past 72 hours she been discharged today on bed rest no sex to follow up with me in 2 weeks

## 2014-04-07 NOTE — Progress Notes (Signed)
Patient ID: Briana Wagner, female   DOB: [birth year], 24 y.o.   MRN: [id number] Vital signs normal No contractions or bleeding Home today to see me in 2 weeks

## 2014-04-08 ENCOUNTER — Encounter (HOSPITAL_COMMUNITY): Payer: Self-pay

## 2014-04-08 ENCOUNTER — Inpatient Hospital Stay (HOSPITAL_COMMUNITY)
Admission: RE | Admit: 2014-04-08 | Discharge: 2014-04-10 | DRG: 781 | Disposition: A | Discharge status: Home / Self Care | Payer: Medicaid Other | Source: Ambulatory Visit | Attending: Obstetrics | Admitting: Obstetrics

## 2014-04-08 DIAGNOSIS — O4693 Antepartum hemorrhage, unspecified, third trimester: Principal | ICD-10-CM | POA: Diagnosis present

## 2014-04-08 DIAGNOSIS — N930 Postcoital and contact bleeding: Secondary | ICD-10-CM | POA: Diagnosis present

## 2014-04-08 DIAGNOSIS — O4690 Antepartum hemorrhage, unspecified, unspecified trimester: Secondary | ICD-10-CM | POA: Diagnosis present

## 2014-04-08 DIAGNOSIS — Z3A32 32 weeks gestation of pregnancy: Secondary | ICD-10-CM

## 2014-04-08 DIAGNOSIS — O99820 Streptococcus B carrier state complicating pregnancy: Secondary | ICD-10-CM | POA: Diagnosis present

## 2014-04-08 LAB — URINALYSIS, ROUTINE W REFLEX MICROSCOPIC
Bilirubin Urine: NEGATIVE
Glucose, UA: NEGATIVE mg/dL
Ketones, ur: NEGATIVE mg/dL
Nitrite: NEGATIVE
Protein, ur: NEGATIVE mg/dL
Specific Gravity, Urine: 1.02 (ref 1.005–1.030)
Urobilinogen, UA: 0.2 mg/dL (ref 0.0–1.0)
pH: 6.5 (ref 5.0–8.0)

## 2014-04-08 LAB — CBC
HCT: 37 % (ref 36.0–46.0)
Hemoglobin: 13.1 g/dL (ref 12.0–15.0)
MCH: 32.4 pg (ref 26.0–34.0)
MCHC: 35.4 g/dL (ref 30.0–36.0)
MCV: 91.6 fL (ref 78.0–100.0)
Platelets: 144 10*3/uL — ABNORMAL LOW (ref 150–400)
RBC: 4.04 MIL/uL (ref 3.87–5.11)
RDW: 13.2 % (ref 11.5–15.5)
WBC: 13.6 10*3/uL — ABNORMAL HIGH (ref 4.0–10.5)

## 2014-04-08 LAB — TYPE AND SCREEN
ABO/RH(D): O POS
Antibody Screen: NEGATIVE

## 2014-04-08 LAB — URINE MICROSCOPIC-ADD ON

## 2014-04-08 MED ORDER — PRENATAL MULTIVITAMIN CH
1.0000 | ORAL_TABLET | Freq: Every day | ORAL | Status: DC
  Administered 2014-04-08 – 2014-04-09 (×2): 1 via ORAL
  Filled 2014-04-08 (×2): qty 1

## 2014-04-08 MED ORDER — CALCIUM CARBONATE ANTACID 500 MG PO CHEW
2.0000 | CHEWABLE_TABLET | ORAL | Status: DC | PRN
Start: 2014-04-08 — End: 2014-04-10

## 2014-04-08 MED ORDER — SODIUM CHLORIDE 0.9 % IJ SOLN
3.0000 mL | Freq: Two times a day (BID) | INTRAMUSCULAR | Status: DC
  Administered 2014-04-08 – 2014-04-09 (×4): 3 mL via INTRAVENOUS

## 2014-04-08 MED ORDER — DOCUSATE SODIUM 100 MG PO CAPS
100.0000 mg | ORAL_CAPSULE | Freq: Every day | ORAL | Status: DC
  Administered 2014-04-08 – 2014-04-09 (×2): 100 mg via ORAL
  Filled 2014-04-08 (×2): qty 1

## 2014-04-08 MED ORDER — ZOLPIDEM TARTRATE 5 MG PO TABS
5.0000 mg | ORAL_TABLET | Freq: Every evening | ORAL | Status: DC | PRN
  Administered 2014-04-08 – 2014-04-09 (×2): 5 mg via ORAL
  Filled 2014-04-08 (×2): qty 1

## 2014-04-08 MED ORDER — NIFEDIPINE ER OSMOTIC RELEASE 30 MG PO TB24
30.0000 mg | ORAL_TABLET | Freq: Two times a day (BID) | ORAL | Status: DC
  Administered 2014-04-08 – 2014-04-10 (×5): 30 mg via ORAL
  Filled 2014-04-08 (×5): qty 1

## 2014-04-08 MED ORDER — ACETAMINOPHEN 325 MG PO TABS
650.0000 mg | ORAL_TABLET | ORAL | Status: DC | PRN
  Administered 2014-04-08: 650 mg via ORAL
  Filled 2014-04-08: qty 2

## 2014-04-08 NOTE — MAU Note (Signed)
[facility] for bed. [facility] and [facility] will call back for report

## 2014-04-08 NOTE — MAU Note (Signed)
[facility] Will go to 153

## 2014-04-08 NOTE — H&P (Signed)
Briana GrayerMelanie D Wagner is a 24 y.o. female presenting for vaginal bleeding.  Recently admitted and discharged for same problem.  No previa on U/S.  Had intercourse recently.  Maternal Medical History:  Reason for admission: Vaginal bleeding.   Contractions: Onset was 6-12 hours ago.    Fetal activity: Perceived fetal activity is normal.   Last perceived fetal movement was within the past hour.    Prenatal complications: no prenatal complications Prenatal Complications - Diabetes: none.    OB History    Gravida Para Term Preterm AB TAB SAB Ectopic Multiple Living   2    1     0     Past Medical History  Diagnosis Date  . Asthma   . Bronchitis    Past Surgical History  Procedure Laterality Date  . Wisdom tooth extraction  2014  . No past surgeries    . Wisdom tooth extraction     Family History: family history includes Aneurysm in her mother; Heart disease in her father; Hypertension in her mother. Social History:  reports that she quit smoking about 6 months ago. She does not have any smokeless tobacco history on file. She reports that she uses illicit drugs (Marijuana). She reports that she does not drink alcohol.   Prenatal Transfer Tool  Maternal Diabetes: No Genetic Screening: Normal Maternal Ultrasounds/Referrals: Normal Fetal Ultrasounds or other Referrals:  None Maternal Substance Abuse:  No Significant Maternal Medications:  None Significant Maternal Lab Results:  None Other Comments:  None  Review of Systems  All other systems reviewed and are negative.   Dilation: Closed Exam by:: Artelia LarocheM. Williams CNM Blood pressure 113/66, pulse 72, temperature 98.4 F (36.9 C), temperature source Axillary, resp. rate 20, height 5\' 3"  (1.6 m), weight 163 lb (73.936 kg), last menstrual period 12/15/2012. Maternal Exam:  Abdomen: Patient reports no abdominal tenderness. Introitus: Normal vulva. Normal vagina.  Cervix: Cervix evaluated by sterile speculum exam.     Physical Exam   Nursing note and vitals reviewed. Constitutional: She is oriented to person, place, and time. She appears well-developed and well-nourished.  HENT:  Head: Normocephalic and atraumatic.  Eyes: Conjunctivae are normal. Pupils are equal, round, and reactive to light.  Neck: Normal range of motion. Neck supple.  Cardiovascular: Normal rate and regular rhythm.   Respiratory: Effort normal and breath sounds normal.  GI: Soft.  Genitourinary: Vagina normal and uterus normal.  Musculoskeletal: Normal range of motion.  Neurological: She is alert and oriented to person, place, and time.  Skin: Skin is warm and dry.  Psychiatric: She has a normal mood and affect. Her behavior is normal. Judgment and thought content normal.    Prenatal labs: ABO, Rh: --/--/O POS, O POS (02/07 0410) Antibody: NEG (02/07 0410) Rubella: Immune (11/10 0000) RPR: Nonreactive (11/10 0000)  HBsAg: Negative (11/10 0000)  HIV: Non-reactive (11/10 0000)  GBS: Positive (02/08 0000)   Assessment/Plan: 32 weeks.  Vaginal bleeding.  Stable.  Admit for observation.   Lyman Balingit A 04/08/2014, 12:29 PM

## 2014-04-08 NOTE — MAU Provider Note (Signed)
History     CSN: [id number]  Arrival date and time: 04/08/14 [id number]   First Provider Initiated Contact with Patient 04/08/14 0915      Chief Complaint  Patient presents with  . Vaginal Bleeding  . Abdominal Pain   HPI This is a 24 y.o. female at 552w3d who presents with c/o bleeding and cramping which started last night. Was just discharged from hospital yesterday after observation for a presumed marginal abruption.     OB History    Gravida Para Term Preterm AB TAB SAB Ectopic Multiple Living   2    1     0      Past Medical History  Diagnosis Date  . Asthma   . Bronchitis     Past Surgical History  Procedure Laterality Date  . Wisdom tooth extraction  2014  . No past surgeries    . Wisdom tooth extraction      Family History  Problem Relation Age of Onset  . Hypertension Mother   . Heart disease Father   . Aneurysm Mother     History  Substance Use Topics  . Smoking status: Former Smoker -- 0.25 packs/day for 5 years    Quit date: 09/30/2013  . Smokeless tobacco: Not on file  . Alcohol Use: No    Allergies:  Allergies  Allergen Reactions  . Shellfish Allergy Hives and Swelling    Prescriptions prior to admission  Medication Sig Dispense Refill Last Dose  . albuterol (PROVENTIL HFA;VENTOLIN HFA) 108 (90 BASE) MCG/ACT inhaler Inhale 2 puffs into the lungs every 6 (six) hours as needed for wheezing or shortness of breath. 1 Inhaler 2 rescue  . Prenatal Vit-Fe Fumarate-FA (PRENATAL MULTIVITAMIN) TABS tablet Take 1 tablet by mouth daily at 12 noon.   04/01/2014 at Unknown time    Review of Systems  Constitutional: Negative for fever, chills and malaise/fatigue.  Gastrointestinal: Positive for abdominal pain. Negative for nausea, vomiting, diarrhea and constipation.  Musculoskeletal: Negative for myalgias.  Neurological: Negative for dizziness, weakness and headaches.   Physical Exam   Blood pressure 124/81, pulse 87, temperature 98 F (36.7 C),  temperature source Oral, resp. rate 20, last menstrual period 12/15/2012.  Physical Exam  Constitutional: She is oriented to person, place, and time. She appears well-developed and well-nourished. No distress.  HENT:  Head: Normocephalic.  Cardiovascular: Normal rate, regular rhythm and normal heart sounds.  Exam reveals no gallop and no friction rub.   No murmur heard. Respiratory: Effort normal.  GI: Soft. She exhibits no distension. There is no tenderness. There is no rebound and no guarding.  Genitourinary: Vaginal discharge (small to mod burgundy blood coming from os.  Cervix long and closed) found.  Musculoskeletal: Normal range of motion.  Neurological: She is alert and oriented to person, place, and time.  Skin: Skin is warm and dry.  Psychiatric: She has a normal mood and affect.   FHR reactive Uterine irritability MAU Course  Procedures  MDM Results for orders placed or performed during the hospital encounter of 04/08/14 (from the past 24 hour(s))  Urinalysis, Routine w reflex microscopic     Status: Abnormal   Collection Time: 04/08/14  8:50 AM  Result Value Ref Range   Color, Urine YELLOW YELLOW   APPearance CLEAR CLEAR   Specific Gravity, Urine 1.020 1.005 - 1.030   pH 6.5 5.0 - 8.0   Glucose, UA NEGATIVE NEGATIVE mg/dL   Hgb urine dipstick LARGE (A) NEGATIVE   Bilirubin Urine  NEGATIVE NEGATIVE   Ketones, ur NEGATIVE NEGATIVE mg/dL   Protein, ur NEGATIVE NEGATIVE mg/dL   Urobilinogen, UA 0.2 0.0 - 1.0 mg/dL   Nitrite NEGATIVE NEGATIVE   Leukocytes, UA SMALL (A) NEGATIVE  Urine microscopic-add on     Status: Abnormal   Collection Time: 04/08/14  8:50 AM  Result Value Ref Range   Squamous Epithelial / LPF FEW (A) RARE   WBC, UA 3-6 <3 WBC/hpf   RBC / HPF 0-2 <3 RBC/hpf   Bacteria, UA FEW (A) RARE     Assessment and Plan  A:  SIUP at [redacted]w[redacted]d        Uterine irritability/preterm labor       Bleeding, presumed marginal abruption  P:  Discussed with Dr  Clearance Coots       Admit to Antenatal for observation and Procardia       [facility] 04/08/2014, 9:46 AM

## 2014-04-08 NOTE — MAU Note (Signed)
Pt presents complaining of vaginal bleeding and abdominal cramping every 5-10 minutes that make her feel like she has to use the bathroom. Was admitted to antenatal for observation last week due to bleeding. Had stopped bleeding but it started back last pm. States cervix was closed. Reports good fetal movement.

## 2014-04-09 ENCOUNTER — Inpatient Hospital Stay (HOSPITAL_COMMUNITY): Payer: Medicaid Other

## 2014-04-09 NOTE — Progress Notes (Signed)
Patient ID: Briana Wagner, female   DOB: [birth year], 24 y.o.   MRN: [id number] Hospital Day: 2  S: No vaginal bleeding.  O: Blood pressure 132/85, pulse 99, temperature 98.7 F (37.1 C), temperature source Oral, resp. rate 18, height 5\' 3"  (1.6 m), weight 163 lb (73.936 kg), last menstrual period 12/15/2012.   WUJ:WJXBJYNWFHT:Baseline: 140 bpm Toco: None GNF:AOZHYQMVSVE:Dilation: Closed Exam by:: Artelia LarocheM. Williams CNM  A/P- 24 y.o. admitted with:  Vaginal bleeding after intercourse.  No previa.  Readmitted for observation.  Present on Admission:  . Vaginal bleeding in pregnancy  Pregnancy Complications: vaginal bleeding  Preterm labor management: Procardia Dating:  2754w4d PNL Needed:  CBC FWB:  good PTL:  none

## 2014-04-09 NOTE — Progress Notes (Signed)
Noted consistent perspiration from all 4 extremities, palms and feet. Pt states this is normal for her.

## 2014-04-10 DIAGNOSIS — Z3A32 32 weeks gestation of pregnancy: Secondary | ICD-10-CM | POA: Insufficient documentation

## 2014-04-10 MED ORDER — NIFEDIPINE ER 30 MG PO TB24: 30.0000 mg | ORAL_TABLET | Freq: Two times a day (BID) | ORAL | Status: DC

## 2014-04-10 NOTE — Progress Notes (Signed)
Discharge instructions reviewed with patient she will she Dr Clearance CootsHarper Thursday in the office.

## 2014-04-10 NOTE — Progress Notes (Signed)
Patient ID: [name], female   DOB: January 04, 1991, 24 y.o.   MRN: [id number] Since patient was  admitted she has not had any bleeding no contractions and she'll be discharged today to see me on Thursday

## 2014-04-10 NOTE — Progress Notes (Signed)
Ur chart review completed.  

## 2014-04-13 ENCOUNTER — Encounter (HOSPITAL_COMMUNITY): Payer: Self-pay | Admitting: *Deleted

## 2014-04-13 ENCOUNTER — Inpatient Hospital Stay (HOSPITAL_COMMUNITY)
Admission: AD | Admit: 2014-04-13 | Discharge: 2014-04-16 | DRG: 775 | Disposition: A | Discharge status: Home / Self Care | Payer: Medicaid Other | Source: Ambulatory Visit | Attending: Obstetrics | Admitting: Obstetrics

## 2014-04-13 ENCOUNTER — Inpatient Hospital Stay (EMERGENCY_DEPARTMENT_HOSPITAL)
Admission: AD | Admit: 2014-04-13 | Discharge: 2014-04-13 | Disposition: A | Discharge status: Home / Self Care | Payer: Medicaid Other | Source: Ambulatory Visit | Attending: Obstetrics | Admitting: Obstetrics

## 2014-04-13 DIAGNOSIS — Z3A33 33 weeks gestation of pregnancy: Secondary | ICD-10-CM

## 2014-04-13 DIAGNOSIS — Z3483 Encounter for supervision of other normal pregnancy, third trimester: Secondary | ICD-10-CM | POA: Diagnosis present

## 2014-04-13 DIAGNOSIS — Z3A34 34 weeks gestation of pregnancy: Secondary | ICD-10-CM

## 2014-04-13 DIAGNOSIS — Z3A31 31 weeks gestation of pregnancy: Secondary | ICD-10-CM

## 2014-04-13 LAB — CBC
HCT: 36.3 % (ref 36.0–46.0)
Hemoglobin: 12.8 g/dL (ref 12.0–15.0)
MCH: 31.9 pg (ref 26.0–34.0)
MCHC: 35.3 g/dL (ref 30.0–36.0)
MCV: 90.5 fL (ref 78.0–100.0)
Platelets: 141 10*3/uL — ABNORMAL LOW (ref 150–400)
RBC: 4.01 MIL/uL (ref 3.87–5.11)
RDW: 12.8 % (ref 11.5–15.5)
WBC: 16.6 10*3/uL — ABNORMAL HIGH (ref 4.0–10.5)

## 2014-04-13 LAB — URINE MICROSCOPIC-ADD ON

## 2014-04-13 LAB — URINALYSIS, ROUTINE W REFLEX MICROSCOPIC
Bilirubin Urine: NEGATIVE
Glucose, UA: 250 mg/dL — AB
Ketones, ur: NEGATIVE mg/dL
Nitrite: NEGATIVE
Protein, ur: NEGATIVE mg/dL
Specific Gravity, Urine: 1.02 (ref 1.005–1.030)
Urobilinogen, UA: 0.2 mg/dL (ref 0.0–1.0)
pH: 6 (ref 5.0–8.0)

## 2014-04-13 LAB — COMPREHENSIVE METABOLIC PANEL
ALT: 14 U/L (ref 0–35)
AST: 19 U/L (ref 0–37)
Albumin: 3.4 g/dL — ABNORMAL LOW (ref 3.5–5.2)
Alkaline Phosphatase: 136 U/L — ABNORMAL HIGH (ref 39–117)
Anion gap: 5 (ref 5–15)
BUN: 10 mg/dL (ref 6–23)
CO2: 20 mmol/L (ref 19–32)
Calcium: 8.8 mg/dL (ref 8.4–10.5)
Chloride: 106 mmol/L (ref 96–112)
Creatinine, Ser: 0.52 mg/dL (ref 0.50–1.10)
GFR calc Af Amer: >90 mL/min (ref >90)
GFR calc non Af Amer: >90 mL/min (ref >90)
Glucose, Bld: 101 mg/dL — ABNORMAL HIGH (ref 70–99)
Potassium: 3.6 mmol/L (ref 3.5–5.1)
Sodium: 131 mmol/L — ABNORMAL LOW (ref 135–145)
Total Bilirubin: 0.5 mg/dL (ref 0.3–1.2)
Total Protein: 6.5 g/dL (ref 6.0–8.3)

## 2014-04-13 LAB — OB RESULTS CONSOLE GBS: GBS: NEGATIVE

## 2014-04-13 MED ORDER — ZOLPIDEM TARTRATE 5 MG PO TABS
5.0000 mg | ORAL_TABLET | Freq: Every evening | ORAL | Status: DC | PRN
  Administered 2014-04-14: 5 mg via ORAL
  Filled 2014-04-13: qty 1

## 2014-04-13 MED ORDER — MAGNESIUM SULFATE 40 G IN LACTATED RINGERS - SIMPLE
3.0000 g/h | INTRAVENOUS | Status: DC
Start: 2014-04-13 — End: 2014-04-14
  Administered 2014-04-14: 2 g/h via INTRAVENOUS
  Filled 2014-04-13: qty 500

## 2014-04-13 MED ORDER — ACETAMINOPHEN 325 MG PO TABS: 650.0000 mg | ORAL_TABLET | ORAL | Status: DC | PRN

## 2014-04-13 MED ORDER — MORPHINE SULFATE 10 MG/ML IJ SOLN
10.0000 mg | Freq: Once | INTRAMUSCULAR | Status: AC
  Administered 2014-04-14: 10 mg via INTRAMUSCULAR
  Filled 2014-04-13: qty 1

## 2014-04-13 MED ORDER — LACTATED RINGERS IV SOLN
INTRAVENOUS | Status: DC
  Administered 2014-04-13 – 2014-04-14 (×3): via INTRAVENOUS

## 2014-04-13 MED ORDER — PRENATAL MULTIVITAMIN CH
1.0000 | ORAL_TABLET | Freq: Every day | ORAL | Status: DC
  Filled 2014-04-13: qty 1

## 2014-04-13 MED ORDER — TERBUTALINE SULFATE 1 MG/ML IJ SOLN
0.2500 mg | Freq: Once | INTRAMUSCULAR | Status: AC
  Administered 2014-04-13: 0.25 mg via SUBCUTANEOUS
  Filled 2014-04-13: qty 1

## 2014-04-13 MED ORDER — DOCUSATE SODIUM 100 MG PO CAPS
100.0000 mg | ORAL_CAPSULE | Freq: Every day | ORAL | Status: DC
  Filled 2014-04-13: qty 1

## 2014-04-13 MED ORDER — CALCIUM CARBONATE ANTACID 500 MG PO CHEW: 2.0000 | CHEWABLE_TABLET | ORAL | Status: DC | PRN

## 2014-04-13 MED ORDER — PROMETHAZINE HCL 25 MG/ML IJ SOLN: 25.0000 mg | Freq: Once | INTRAMUSCULAR | Status: DC

## 2014-04-13 MED ORDER — MAGNESIUM SULFATE BOLUS VIA INFUSION
4.0000 g | Freq: Once | INTRAVENOUS | Status: AC
  Administered 2014-04-13: 4 g via INTRAVENOUS
  Filled 2014-04-13: qty 500

## 2014-04-13 NOTE — Discharge Instructions (Signed)

## 2014-04-13 NOTE — MAU Note (Signed)
Urine in lab 

## 2014-04-13 NOTE — MAU Provider Note (Signed)
History     CSN: [id number]  Arrival date and time: 04/13/14 1042   First Provider Initiated Contact with Patient 04/13/14 1139      Chief Complaint  Patient presents with  . Contractions   HPI This is a 24 y.o. female at 3070w1d who presents with c/o contractions.  Last cervical exam was 1cm in office. Denies leaking or bleeding.   RN Note: Sent from office, eval of PTL. Contractions started last night, thought is was gas. Dr Gaynell FaceMarshall checked her- was 1 cm. No bleeding or leaking          OB History    Gravida Para Term Preterm AB TAB SAB Ectopic Multiple Living   2 1  1 1     0 1      Past Medical History  Diagnosis Date  . Asthma   . Bronchitis     Past Surgical History  Procedure Laterality Date  . Wisdom tooth extraction  2014  . No past surgeries    . Wisdom tooth extraction      Family History  Problem Relation Age of Onset  . Hypertension Mother   . Aneurysm Mother   . Stroke Mother   . Heart disease Father     History  Substance Use Topics  . Smoking status: Former Smoker -- 0.25 packs/day for 5 years    Quit date: 09/30/2013  . Smokeless tobacco: Never Used  . Alcohol Use: No    Allergies:  Allergies  Allergen Reactions  . Shellfish Allergy Hives and Swelling    Prescriptions prior to admission  Medication Sig Dispense Refill Last Dose  . NIFEdipine (PROCARDIA-XL/ADALAT CC) 30 MG 24 hr tablet Take 1 tablet (30 mg total) by mouth 2 (two) times daily. 30 tablet 0 04/12/2014 at Unknown time  . Prenatal Vit-Fe Fumarate-FA (PRENATAL MULTIVITAMIN) TABS tablet Take 1 tablet by mouth daily at 12 noon.   04/10/2014  . albuterol (PROVENTIL HFA;VENTOLIN HFA) 108 (90 BASE) MCG/ACT inhaler Inhale 2 puffs into the lungs every 6 (six) hours as needed for wheezing or shortness of breath. 1 Inhaler 2 rescue    Review of Systems  Constitutional: Negative for fever, chills and malaise/fatigue.  Gastrointestinal: Positive for abdominal pain. Negative for  nausea, vomiting, diarrhea and constipation.  Neurological: Negative for dizziness.   Physical Exam   Blood pressure 124/77, pulse 85, temperature 98.9 F (37.2 C), temperature source Oral, resp. rate 18, height 5\' 3"  (1.6 m), weight 159 lb (72.122 kg), last menstrual period 12/15/2012.  Physical Exam  Constitutional: She is oriented to person, place, and time. She appears well-developed and well-nourished. No distress.  Cardiovascular: Normal rate.   Respiratory: Effort normal.  Musculoskeletal: Normal range of motion.  Neurological: She is alert and oriented to person, place, and time.  Skin: Skin is warm and dry.  Psychiatric: She has a normal mood and affect.   FHR reactive UCs every 5-7 minutes   Cervix 1/long/-3  MAU Course  Procedures  MDM Terbutaline given and contractions stopped.   Assessment and Plan  A;  SIUP at 6842w6d        Preterm contractions with no cervical change  P:  Discharge home   Medication List    TAKE these medications        albuterol 108 (90 BASE) MCG/ACT inhaler  Commonly known as:  PROVENTIL HFA;VENTOLIN HFA  Inhale 2 puffs into the lungs every 6 (six) hours as needed for wheezing or shortness of breath.  NIFEdipine 30 MG 24 hr tablet  Commonly known as:  PROCARDIA-XL/ADALAT CC  Take 1 tablet (30 mg total) by mouth 2 (two) times daily.     prenatal multivitamin Tabs tablet  Take 1 tablet by mouth daily at 12 noon.         [employer] 04/13/2014, 11:39 AM

## 2014-04-13 NOTE — MAU Note (Signed)
Pt states she started having contractions again at 2100

## 2014-04-13 NOTE — MAU Note (Signed)
PT  SAYS SHE WAS HERE-  LEFT AT 11AM   REC TERB WHILE  HERE.   HOME ON  PROCARDIA.   LAST TAKEN 30 MG  AT  10 PM.     UC  STARTED AGAIN AT 3 PM  BUT   HURT WORSE  AT   9PM           VE TODAY  1  CM    DENIES  SROM AND BLEEDING     AND  NO SEX

## 2014-04-13 NOTE — MAU Note (Signed)
Sent from office, eval of PTL. Contractions started last night, thought is was gas. Dr Gaynell FaceMarshall checked her- was 1 cm. No bleeding or leaking

## 2014-04-13 NOTE — MAU Provider Note (Signed)
History     CSN: [id number]  Arrival date and time: 04/13/14 2239   None     No chief complaint on file.  HPI [name] is a 24 y.o. G2P0010 at [redacted]w[redacted]d who presents to MAU today with complaint of contractions q 5 minutes since ~ 2100 today. The patient has been admitted twice in the last 2 weeks for preterm contractions. She was last discharged on Monday and followed up in the office with Dr. Gaynell Wagner this afternoon. He checked her cervix and per patient she was 1 cm dilated. He sent her to MAU for management of contractions. At that time she was having contractions q 2-8 minutes. She was given Terbutaline and contractions stopped. Patient states that they resumed this evening and have been q 5 minutes with moderate to severe pain. She denies LOF, but has continued to have some bleeding which she states Dr. Gaynell Wagner told her was normal. She reports good fetal movement and denies other complications with the pregnancy.   OB History    Gravida Para Term Preterm AB TAB SAB Ectopic Multiple Living   2    1     0      Past Medical History  Diagnosis Date  . Asthma   . Bronchitis     Past Surgical History  Procedure Laterality Date  . Wisdom tooth extraction  2014  . No past surgeries    . Wisdom tooth extraction      Family History  Problem Relation Age of Onset  . Hypertension Mother   . Aneurysm Mother   . Stroke Mother   . Heart disease Father     History  Substance Use Topics  . Smoking status: Former Smoker -- 0.25 packs/day for 5 years    Quit date: 09/30/2013  . Smokeless tobacco: Never Used  . Alcohol Use: No    Allergies:  Allergies  Allergen Reactions  . Shellfish Allergy Hives and Swelling    Prescriptions prior to admission  Medication Sig Dispense Refill Last Dose  . albuterol (PROVENTIL HFA;VENTOLIN HFA) 108 (90 BASE) MCG/ACT inhaler Inhale 2 puffs into the lungs every 6 (six) hours as needed for wheezing or shortness of breath. 1 Inhaler 2  rescue  . NIFEdipine (PROCARDIA-XL/ADALAT CC) 30 MG 24 hr tablet Take 1 tablet (30 mg total) by mouth 2 (two) times daily. 30 tablet 0 04/12/2014 at Unknown time  . Prenatal Vit-Fe Fumarate-FA (PRENATAL MULTIVITAMIN) TABS tablet Take 1 tablet by mouth daily at 12 noon.   04/10/2014    Review of Systems  Constitutional: Negative for fever and malaise/fatigue.  Gastrointestinal: Positive for abdominal pain.  Genitourinary:       + vaginal bleeding Neg - LOF   Physical Exam   Blood pressure 126/77, pulse 113, temperature 98.4 F (36.9 C), temperature source Oral, resp. rate 18, last menstrual period 12/15/2012.  Physical Exam  Constitutional: She is oriented to person, place, and time. She appears well-developed and well-nourished. No distress.  HENT:  Head: Normocephalic.  Cardiovascular: Tachycardia present.   Respiratory: Effort normal.  GI: Soft. She exhibits no distension. There is no tenderness.  Neurological: She is alert and oriented to person, place, and time.  Skin: Skin is warm and dry. No erythema.  Psychiatric: She has a normal mood and affect.  Dilation: 1.5 Effacement (%): 80 Cervical Position: Middle Station: -1  Results for orders placed or performed during the hospital encounter of 04/13/14 (from the past 24 hour(s))  Urinalysis,  Routine w reflex microscopic     Status: Abnormal   Collection Time: 04/13/14 10:51 PM  Result Value Ref Range   Color, Urine YELLOW YELLOW   APPearance CLEAR CLEAR   Specific Gravity, Urine 1.020 1.005 - 1.030   pH 6.0 5.0 - 8.0   Glucose, UA 250 (A) NEGATIVE mg/dL   Hgb urine dipstick MODERATE (A) NEGATIVE   Bilirubin Urine NEGATIVE NEGATIVE   Ketones, ur NEGATIVE NEGATIVE mg/dL   Protein, ur NEGATIVE NEGATIVE mg/dL   Urobilinogen, UA 0.2 0.0 - 1.0 mg/dL   Nitrite NEGATIVE NEGATIVE   Leukocytes, UA MODERATE (A) NEGATIVE  Urine microscopic-add on     Status: Abnormal   Collection Time: 04/13/14 10:51 PM  Result Value Ref Range    Squamous Epithelial / LPF FEW (A) RARE   WBC, UA 7-10 <3 WBC/hpf   RBC / HPF 0-2 <3 RBC/hpf   Bacteria, UA FEW (A) RARE   Urine-Other MUCOUS PRESENT    Fetal Monitoring: Baseline: 130 bpm, moderate variability, + accelerations, variable deceleration noted Contractions: q 6 minutes  MAU Course  Procedures None  MDM UA today Discussed patient with Briana Wagner. Admit to Antenatal. Routine orders, bedrest with bathroom privileges, insert foley catheter, 10 mg Morphine IM, 25 mg Phenergan IM and start MgSO4 with 4 g loading dose and 2g/hr.   Assessment and Plan  A: SIUP at 735w1d Preterm labor  P: Admit to antenatal for tocolysis Verbal orders as written above entered in Epic  [employer]ears Holdings CorporationJulie N Wenzel, PA-C  04/13/2014, 11:13 PM

## 2014-04-14 ENCOUNTER — Inpatient Hospital Stay (HOSPITAL_COMMUNITY): Payer: Medicaid Other | Admitting: Anesthesiology

## 2014-04-14 ENCOUNTER — Encounter (HOSPITAL_COMMUNITY): Payer: Self-pay | Admitting: *Deleted

## 2014-04-14 LAB — TYPE AND SCREEN
ABO/RH(D): O POS
Antibody Screen: NEGATIVE

## 2014-04-14 LAB — GROUP B STREP BY PCR: Group B strep by PCR: NEGATIVE

## 2014-04-14 MED ORDER — BUTORPHANOL TARTRATE 1 MG/ML IJ SOLN
1.0000 mg | Freq: Once | INTRAMUSCULAR | Status: AC
  Administered 2014-04-14: 1 mg via INTRAVENOUS
  Filled 2014-04-14: qty 1

## 2014-04-14 MED ORDER — ZOLPIDEM TARTRATE 5 MG PO TABS
5.0000 mg | ORAL_TABLET | Freq: Every evening | ORAL | Status: DC | PRN
  Administered 2014-04-14 – 2014-04-15 (×2): 5 mg via ORAL
  Filled 2014-04-14 (×2): qty 1

## 2014-04-14 MED ORDER — OXYCODONE-ACETAMINOPHEN 5-325 MG PO TABS
2.0000 | ORAL_TABLET | ORAL | Status: DC | PRN
  Administered 2014-04-15: 2 via ORAL
  Filled 2014-04-14: qty 2

## 2014-04-14 MED ORDER — LIDOCAINE HCL (PF) 1 % IJ SOLN
INTRAMUSCULAR | Status: DC | PRN
  Administered 2014-04-14 (×2): 8 mL

## 2014-04-14 MED ORDER — BENZOCAINE-MENTHOL 20-0.5 % EX AERO
1.0000 "application " | INHALATION_SPRAY | CUTANEOUS | Status: DC | PRN
  Administered 2014-04-14: 1 via TOPICAL
  Filled 2014-04-14 (×2): qty 56

## 2014-04-14 MED ORDER — EPHEDRINE 5 MG/ML INJ
10.0000 mg | INTRAVENOUS | Status: DC | PRN
Start: 2014-04-14 — End: 2014-04-14

## 2014-04-14 MED ORDER — EPHEDRINE 5 MG/ML INJ: 10.0000 mg | INTRAVENOUS | Status: DC | PRN

## 2014-04-14 MED ORDER — FENTANYL 2.5 MCG/ML BUPIVACAINE 1/10 % EPIDURAL INFUSION (WH - ANES)
14.0000 mL/h | INTRAMUSCULAR | Status: DC | PRN
  Filled 2014-04-14: qty 125

## 2014-04-14 MED ORDER — WITCH HAZEL-GLYCERIN EX PADS: 1.0000 "application " | MEDICATED_PAD | CUTANEOUS | Status: DC | PRN

## 2014-04-14 MED ORDER — METHYLERGONOVINE MALEATE 0.2 MG/ML IJ SOLN
INTRAMUSCULAR | Status: AC
Start: 2014-04-14 — End: 2014-04-15
  Filled 2014-04-14: qty 1

## 2014-04-14 MED ORDER — PHENYLEPHRINE 40 MCG/ML (10ML) SYRINGE FOR IV PUSH (FOR BLOOD PRESSURE SUPPORT): 80.0000 ug | PREFILLED_SYRINGE | INTRAVENOUS | Status: DC | PRN

## 2014-04-14 MED ORDER — ONDANSETRON HCL 4 MG/2ML IJ SOLN
4.0000 mg | INTRAMUSCULAR | Status: DC | PRN
  Administered 2014-04-14: 4 mg via INTRAVENOUS
  Filled 2014-04-14: qty 2

## 2014-04-14 MED ORDER — LANOLIN HYDROUS EX OINT: TOPICAL_OINTMENT | CUTANEOUS | Status: DC | PRN

## 2014-04-14 MED ORDER — ONDANSETRON HCL 4 MG PO TABS
4.0000 mg | ORAL_TABLET | ORAL | Status: DC | PRN
Start: 2014-04-14 — End: 2014-04-16

## 2014-04-14 MED ORDER — IBUPROFEN 600 MG PO TABS
600.0000 mg | ORAL_TABLET | Freq: Four times a day (QID) | ORAL | Status: DC
  Administered 2014-04-14 – 2014-04-16 (×8): 600 mg via ORAL
  Filled 2014-04-14 (×8): qty 1

## 2014-04-14 MED ORDER — FERROUS SULFATE 325 (65 FE) MG PO TABS
325.0000 mg | ORAL_TABLET | Freq: Two times a day (BID) | ORAL | Status: DC
  Administered 2014-04-15 – 2014-04-16 (×3): 325 mg via ORAL
  Filled 2014-04-14 (×5): qty 1

## 2014-04-14 MED ORDER — DIPHENHYDRAMINE HCL 25 MG PO CAPS: 25.0000 mg | ORAL_CAPSULE | Freq: Four times a day (QID) | ORAL | Status: DC | PRN

## 2014-04-14 MED ORDER — TETANUS-DIPHTH-ACELL PERTUSSIS 5-2.5-18.5 LF-MCG/0.5 IM SUSP: 0.5000 mL | Freq: Once | INTRAMUSCULAR | Status: DC

## 2014-04-14 MED ORDER — BUTORPHANOL TARTRATE 1 MG/ML IJ SOLN
1.0000 mg | INTRAMUSCULAR | Status: DC | PRN
  Administered 2014-04-14: 1 mg via INTRAVENOUS
  Filled 2014-04-14: qty 1

## 2014-04-14 MED ORDER — METHYLERGONOVINE MALEATE 0.2 MG/ML IJ SOLN
0.2000 mg | Freq: Once | INTRAMUSCULAR | Status: AC
  Administered 2014-04-14: 0.2 mg via INTRAMUSCULAR

## 2014-04-14 MED ORDER — OXYCODONE-ACETAMINOPHEN 5-325 MG PO TABS
1.0000 | ORAL_TABLET | ORAL | Status: DC | PRN
  Administered 2014-04-14 – 2014-04-16 (×3): 1 via ORAL
  Filled 2014-04-14 (×3): qty 1

## 2014-04-14 MED ORDER — SIMETHICONE 80 MG PO CHEW: 80.0000 mg | CHEWABLE_TABLET | ORAL | Status: DC | PRN

## 2014-04-14 MED ORDER — LACTATED RINGERS IV SOLN
500.0000 mL | Freq: Once | INTRAVENOUS | Status: AC
  Administered 2014-04-14: 12:00:00 via INTRAVENOUS

## 2014-04-14 MED ORDER — OXYTOCIN 40 UNITS IN LACTATED RINGERS INFUSION - SIMPLE MED
1.0000 m[IU]/min | INTRAVENOUS | Status: DC
  Administered 2014-04-14: 2 m[IU]/min via INTRAVENOUS

## 2014-04-14 MED ORDER — DIPHENHYDRAMINE HCL 50 MG/ML IJ SOLN: 12.5000 mg | INTRAMUSCULAR | Status: DC | PRN

## 2014-04-14 MED ORDER — PRENATAL MULTIVITAMIN CH
1.0000 | ORAL_TABLET | Freq: Every day | ORAL | Status: DC
  Administered 2014-04-15 – 2014-04-16 (×2): 1 via ORAL
  Filled 2014-04-14 (×2): qty 1

## 2014-04-14 MED ORDER — SENNOSIDES-DOCUSATE SODIUM 8.6-50 MG PO TABS
2.0000 | ORAL_TABLET | ORAL | Status: DC
  Administered 2014-04-14 – 2014-04-15 (×2): 2 via ORAL
  Filled 2014-04-14 (×2): qty 2

## 2014-04-14 MED ORDER — PROMETHAZINE HCL 25 MG/ML IJ SOLN
12.5000 mg | Freq: Four times a day (QID) | INTRAMUSCULAR | Status: DC | PRN
  Administered 2014-04-14: 12.5 mg via INTRAVENOUS
  Filled 2014-04-14: qty 1

## 2014-04-14 MED ORDER — FENTANYL 2.5 MCG/ML BUPIVACAINE 1/10 % EPIDURAL INFUSION (WH - ANES)
INTRAMUSCULAR | Status: DC | PRN
  Administered 2014-04-14: 14 mL/h via EPIDURAL

## 2014-04-14 MED ORDER — OXYTOCIN 40 UNITS IN LACTATED RINGERS INFUSION - SIMPLE MED
INTRAVENOUS | Status: AC
  Filled 2014-04-14: qty 1000

## 2014-04-14 MED ORDER — PHENYLEPHRINE 40 MCG/ML (10ML) SYRINGE FOR IV PUSH (FOR BLOOD PRESSURE SUPPORT)
80.0000 ug | PREFILLED_SYRINGE | INTRAVENOUS | Status: DC | PRN
  Filled 2014-04-14: qty 20

## 2014-04-14 MED ORDER — DIBUCAINE 1 % RE OINT
1.0000 "application " | TOPICAL_OINTMENT | RECTAL | Status: DC | PRN
  Filled 2014-04-14: qty 28

## 2014-04-14 MED ORDER — TERBUTALINE SULFATE 1 MG/ML IJ SOLN: 0.2500 mg | Freq: Once | INTRAMUSCULAR | Status: DC | PRN

## 2014-04-14 NOTE — Progress Notes (Signed)
Patient ID: Briana Wagner, female   DOB: 08/13/1990, 24 y.o.   MRN: [id number] Patient was on 3 g of magnesium sulfate since admission however her contractions became stronger so the magnesium was discontinued she was transferred to labor and delivery she is no 5 cm 100% vertex 0 station amniotomy performed the fluids clear

## 2014-04-14 NOTE — Anesthesia Preprocedure Evaluation (Signed)
Anesthesia Evaluation  Patient identified by MRN, date of birth, ID band Patient awake    Reviewed: Allergy & Precautions, H&P , NPO status , Patient's Chart, lab work & pertinent test results  Airway Mallampati: I  TM Distance: >3 FB Neck ROM: full    Dental no notable dental hx.    Pulmonary neg pulmonary ROS, former smoker,    Pulmonary exam normal       Cardiovascular negative cardio ROS      Neuro/Psych negative neurological ROS  negative psych ROS   GI/Hepatic negative GI ROS, Neg liver ROS,   Endo/Other  negative endocrine ROS  Renal/GU negative Renal ROS     Musculoskeletal   Abdominal Normal abdominal exam  (+)   Peds  Hematology negative hematology ROS (+)   Anesthesia Other Findings   Reproductive/Obstetrics (+) Pregnancy                             Anesthesia Physical Anesthesia Plan  ASA: II  Anesthesia Plan: Epidural   Post-op Pain Management:    Induction:   Airway Management Planned:   Additional Equipment:   Intra-op Plan:   Post-operative Plan:   Informed Consent: I have reviewed the patients History and Physical, chart, labs and discussed the procedure including the risks, benefits and alternatives for the proposed anesthesia with the patient or authorized representative who has indicated his/her understanding and acceptance.     Plan Discussed with:   Anesthesia Plan Comments:         Anesthesia Quick Evaluation

## 2014-04-14 NOTE — Anesthesia Procedure Notes (Signed)
Epidural Patient location during procedure: OB Start time: 04/14/2014 12:22 PM End time: 04/14/2014 12:26 PM  Staffing Anesthesiologist: Leilani AbleHATCHETT, Herny Scurlock Performed by: anesthesiologist   Preanesthetic Checklist Completed: patient identified, surgical consent, pre-op evaluation, timeout performed, IV checked, risks and benefits discussed and monitors and equipment checked  Epidural Patient position: sitting Prep: site prepped and draped and DuraPrep Patient monitoring: continuous pulse ox and blood pressure Approach: midline Location: L3-L4 Injection technique: LOR air  Needle:  Needle type: Tuohy  Needle gauge: 17 G Needle length: 9 cm and 9 Needle insertion depth: 4 cm Catheter type: closed end flexible Catheter size: 19 Gauge Catheter at skin depth: 10 cm Test dose: negative and Other  Assessment Sensory level: T9 Events: blood not aspirated, injection not painful, no injection resistance, negative IV test and no paresthesia

## 2014-04-14 NOTE — Progress Notes (Signed)
NICU requested to delivery

## 2014-04-14 NOTE — H&P (Signed)
History     CSN: [id number]  Arrival date and time: 04/13/14 2239   None     No chief complaint on file.  HPI Ms. Briana GrayerMelanie D Wagner is a 24 y.o. G2P0010 at 5969w1d who presents to MAU today with complaint of contractions q 5 minutes since ~ 2100 today. The patient has been admitted twice in the last 2 weeks for preterm contractions. She was last discharged on Monday and followed up in the office with Dr. Gaynell FaceMarshall this afternoon. He checked her cervix and per patient she was 1 cm dilated. He sent her to MAU for management of contractions. At that time she was having contractions q 2-8 minutes. She was given Terbutaline and contractions stopped. Patient states that they resumed this evening and have been q 5 minutes with moderate to severe pain. She denies LOF, but has continued to have some bleeding which she states Dr. Gaynell FaceMarshall told her was normal. She reports good fetal movement and denies other complications with the pregnancy.   OB History    Gravida Para Term Preterm AB TAB SAB Ectopic Multiple Living   2    1     0      Past Medical History  Diagnosis Date  . Asthma   . Bronchitis     Past Surgical History  Procedure Laterality Date  . Wisdom tooth extraction  2014  . No past surgeries    . Wisdom tooth extraction      Family History  Problem Relation Age of Onset  . Hypertension Mother   . Aneurysm Mother   . Stroke Mother   . Heart disease Father     History  Substance Use Topics  . Smoking status: Former Smoker -- 0.25 packs/day for 5 years    Quit date: 09/30/2013  . Smokeless tobacco: Never Used  . Alcohol Use: No    Allergies:  Allergies  Allergen Reactions  . Shellfish Allergy Hives and Swelling    Prescriptions prior to admission  Medication Sig Dispense Refill Last Dose  . albuterol (PROVENTIL HFA;VENTOLIN HFA) 108 (90 BASE) MCG/ACT inhaler Inhale 2 puffs into the lungs every 6 (six) hours as needed for wheezing or shortness of breath. 1 Inhaler 2  rescue  . NIFEdipine (PROCARDIA-XL/ADALAT CC) 30 MG 24 hr tablet Take 1 tablet (30 mg total) by mouth 2 (two) times daily. 30 tablet 0 04/12/2014 at Unknown time  . Prenatal Vit-Fe Fumarate-FA (PRENATAL MULTIVITAMIN) TABS tablet Take 1 tablet by mouth daily at 12 noon.   04/10/2014    Review of Systems  Constitutional: Negative for fever and malaise/fatigue.  Gastrointestinal: Positive for abdominal pain.  Genitourinary:       + vaginal bleeding Neg - LOF   Physical Exam   Blood pressure 126/77, pulse 113, temperature 98.4 F (36.9 C), temperature source Oral, resp. rate 18, last menstrual period 12/15/2012.  Physical Exam  Constitutional: She is oriented to person, place, and time. She appears well-developed and well-nourished. No distress.  HENT:  Head: Normocephalic.  Cardiovascular: Tachycardia present.   Respiratory: Effort normal.  GI: Soft. She exhibits no distension. There is no tenderness.  Neurological: She is alert and oriented to person, place, and time.  Skin: Skin is warm and dry. No erythema.  Psychiatric: She has a normal mood and affect.  Dilation: 1.5 Effacement (%): 80 Cervical Position: Middle Station: -1  Results for orders placed or performed during the hospital encounter of 04/13/14 (from the past 24 hour(s))  Urinalysis,  Routine w reflex microscopic     Status: Abnormal   Collection Time: 04/13/14 10:51 PM  Result Value Ref Range   Color, Urine YELLOW YELLOW   APPearance CLEAR CLEAR   Specific Gravity, Urine 1.020 1.005 - 1.030   pH 6.0 5.0 - 8.0   Glucose, UA 250 (A) NEGATIVE mg/dL   Hgb urine dipstick MODERATE (A) NEGATIVE   Bilirubin Urine NEGATIVE NEGATIVE   Ketones, ur NEGATIVE NEGATIVE mg/dL   Protein, ur NEGATIVE NEGATIVE mg/dL   Urobilinogen, UA 0.2 0.0 - 1.0 mg/dL   Nitrite NEGATIVE NEGATIVE   Leukocytes, UA MODERATE (A) NEGATIVE  Urine microscopic-add on     Status: Abnormal   Collection Time: 04/13/14 10:51 PM  Result Value Ref Range    Squamous Epithelial / LPF FEW (A) RARE   WBC, UA 7-10 <3 WBC/hpf   RBC / HPF 0-2 <3 RBC/hpf   Bacteria, UA FEW (A) RARE   Urine-Other MUCOUS PRESENT    Fetal Monitoring: Baseline: 130 bpm, moderate variability, + accelerations, variable deceleration noted Contractions: q 6 minutes  MAU Course  Procedures None  MDM UA today Discussed patient with Dr. Clearance Coots. Admit to Antenatal. Routine orders, bedrest with bathroom privileges, insert foley catheter, 10 mg Morphine IM, 25 mg Phenergan IM and start MgSO4 with 4 g loading dose and 2g/hr.   Assessment and Plan  A: SIUP at [redacted]w[redacted]d Preterm labor  P: Admit to antenatal for tocolysis Verbal orders as written above entered in Epic  Coral Ceo MD

## 2014-04-14 NOTE — Progress Notes (Signed)
Patient ID: Briana Wagner, female   DOB: [birth year], 24 y.o.   MRN: [id number] Vital signs normal Patient is 34 weeks and she still contracting on 3 g of mag with disc space Stout The cervix is 1-2 cm 100% and vertex at 0 station is on a small amount of blood noted on she already received betamethasone on the previous admission and would be kept in the hospital until delivery

## 2014-04-14 NOTE — Progress Notes (Signed)
Transferred to L&D

## 2014-04-14 NOTE — Consult Note (Signed)
Neonatology Note:   Attendance at C-section:   I was asked by Dr. Marshall to attend this NSVD at 33 6/7 weeks after onset of PTL and vaginal bleeding. The mother is a G2P0A1 O pos, GBS neg with a history of PTL, on Procardia. She got Magnesium sulfate neuroprophylaxis today. ROM 5 hours prior to delivery, fluid clear, and mother was afebrile during labor. At delivery, infant was floppy without spontaneous cry. However, with bulb suctioning, he began to cry. His color pinked up, but he had decreased air exchange on auscultation, so neopuff CPAP was applied. A pulse oximeter was placed and O2 saturations were kept within target range throughout. Needed only minimal bulb suctioning. Ap 7/9. I spoke with his parents, and mother viewed the baby briefly, then we transported him to the NICU for further care, with his father in attendance.  Jayko Voorhees C. Saivion Goettel, MD 

## 2014-04-15 LAB — CBC
HCT: 35 % — ABNORMAL LOW (ref 36.0–46.0)
Hemoglobin: 12.3 g/dL (ref 12.0–15.0)
MCH: 32.6 pg (ref 26.0–34.0)
MCHC: 35.1 g/dL (ref 30.0–36.0)
MCV: 92.8 fL (ref 78.0–100.0)
Platelets: 138 10*3/uL — ABNORMAL LOW (ref 150–400)
RBC: 3.77 MIL/uL — ABNORMAL LOW (ref 3.87–5.11)
RDW: 13.2 % (ref 11.5–15.5)
WBC: 17.8 10*3/uL — ABNORMAL HIGH (ref 4.0–10.5)

## 2014-04-15 NOTE — Anesthesia Postprocedure Evaluation (Signed)
Anesthesia Post Note  Patient: Briana GrayerMelanie D Wagner  Procedure(s) Performed: * No procedures listed *  Anesthesia type: Epidural  Patient location: Mother/Baby  Post pain: Pain level controlled  Post assessment: Post-op Vital signs reviewed  Last Vitals:  Filed Vitals:   04/15/14 0832  BP: 109/84  Pulse: 67  Temp: 36.4 C  Resp: 18    Post vital signs: Reviewed  Level of consciousness:alert  Complications: No apparent anesthesia complications

## 2014-04-15 NOTE — Clinical Social Work Maternal (Signed)
Clinical Social Work Department PSYCHOSOCIAL ASSESSMENT - MATERNAL/CHILD 04/15/2014  Patient:  Briana Wagner  Account Number:  [id number]  [place] Date:  04/13/2014  Briana Wagner    Clinical Social Worker:  Gerri Spore, LCSW   Date/Time:  04/15/2014 12:30 PM  Date Referred:  04/15/2014   Referral source  NICU     Referred reason  NICU  Substance Abuse   Other referral source:    I:  FAMILY / [place] legal guardian:  [place] - Name [facility] - Age Guardian - Address  Briana Wagner [address] [place], [place] [address]  Briana Wagner 24    Other household support members/support persons Name Relationship DOB  Briana Wagner FATHER    Other support:    II  PSYCHOSOCIAL DATA Information Source:  Patient Interview  Financial and Community Resources Employment:   Landscape architect resources:  Medicaid If [place]:  Briana Wagner Other  [place] / Grade:   Maternity Care Coordinator / Child Services Coordination / Early Interventions:  Cultural issues impacting care:    III  STRENGTHS Strengths  Adequate Resources  Home prepared for Child (including basic supplies)  Supportive family/friends   Strength comment:    IV  RISK FACTORS AND CURRENT PROBLEMS Current Problem:  YES   Risk Factor & Current Problem Patient Issue Family Issue Risk Factor / Current Problem Comment  Substance Abuse Y N Hx of MJ use    V  SOCIAL WORK ASSESSMENT CSW met with pt to assess her current social situation, frequency of MJ use & offer resources as needed.  Pt is a 24 year old, who lives with her father.  When CSW arrived, pt appears to be doing well emotionally but expressed feelings of sadness.  She told SW that she is having a difficult time looking at her baby connected to machines, as it reminds her of her parents.  She explained that her mother was  on a ventilator & passes away in 05-21-2006.  In 08/22/22 her father was  shot multiple times while driving a cab & was in a coma for 1 1/2 year on a ventilator.  She describes her father as a "walk miracle" because he survived.  She denies hx of depression.  CSW offered counseling resources however she declined.  CSW encouraged her to contact NICU CSW if she changes her mind or need additional support during Briana Wagner's hospitalization.  Hx of MJ noted in chart.  Pt admits to smoking MJ during the pregnancy but denies any use in "3-4 months."  CSW informed pt about hospital drug testing policy & she verbalized an understanding.  UDS is negative, meconium results are pending.  Since he was born prematurely, she doesn't have any supplies.  She was a baby shower planned for March 12th & expects to get a lot of items.  FOB is involved & supportive, per pt.  She has selected Briana Wagner as the infants pediatrician.  She has transportation to the hospital & understands the importance of visiting regularly.  Pt appears to be doing well at this time.  CSW will continue to follow & assist as needed until discharge.      VI SOCIAL WORK PLAN Social Work Plan  Psychosocial Support/Ongoing Assessment of Needs   Type of pt/family education:   If child protective services report - county:   If child protective services report - date:   Information/referral  to community resources comment:   Other social work plan:

## 2014-04-15 NOTE — Progress Notes (Signed)
Patient ID: Briana Wagner, female   DOB: [birth year], 24 y.o.   MRN: [id number] Postpartum day one Vital signs normal Fundus firm Lochia moderate Doing well

## 2014-04-16 LAB — RPR: RPR Ser Ql: NONREACTIVE

## 2014-04-16 NOTE — Discharge Instructions (Signed)
Discharge instructions   You can wash your hair  Shower  Eat what you want  Drink what you want  See me in 6 weeks  Your ankles are going to swell more in the next 2 weeks than when pregnant  No sex for 6 weeks   Elora Wolter A, MD 04/16/2014

## 2014-04-16 NOTE — Discharge Summary (Signed)
Obstetric Discharge Summary Reason for Admission: observation/evaluation Prenatal Procedures: none Intrapartum Procedures: spontaneous vaginal delivery Postpartum Procedures: none Complications-Operative and Postpartum: none HEMOGLOBIN  Date Value Ref Range Status  04/15/2014 12.3 12.0 - 15.0 g/dL Final   HCT  Date Value Ref Range Status  04/15/2014 35.0* 36.0 - 46.0 % Final    Physical Exam:  General: alert Lochia: appropriate Uterine Fundus: firm Incision: healing well DVT Evaluation: No evidence of DVT seen on physical exam.  Discharge Diagnoses: Premature labor  Discharge Information: Date: 04/16/2014 Activity: pelvic rest Diet: routine Medications: Percocet Condition: improved Instructions: refer to practice specific booklet Discharge to: home Follow-up Information    Follow up with Briana CosierMARSHALL,Yousif Edelson A, MD.   Specialty:  Obstetrics and Gynecology   Contact information:   [address] [place] [id number] [phone]       Newborn Data: Live born female  Birth Weight: 4 lb 1.6 oz (1860 g) APGAR: 7, 9  Home with baby in nicu.  Briana Wagner 04/16/2014, 6:49 AM

## 2014-04-16 NOTE — Progress Notes (Signed)

## 2014-04-17 NOTE — Progress Notes (Signed)
Ur chart review completed.  

## 2014-04-24 NOTE — Discharge Instructions (Signed)
Discharge instructions   You can wash your hair  Shower  Eat what you want  Drink what you want  See me in 6 weeks  Your ankles are going to swell more in the next 2 weeks than when pregnant  No sex for 6 weeks   Addley Ballinger A, MD 04/10/2014   Pelvic Rest Pelvic rest is sometimes recommended for women when:  2. The placenta is partially or completely covering the opening of the cervix (placenta previa). 3. There is bleeding between the uterine wall and the amniotic sac in the first trimester (subchorionic hemorrhage). 4. The cervix begins to open without labor starting (incompetent cervix, cervical insufficiency). 5. The labor is too early (preterm labor). HOME CARE INSTRUCTIONS 7. Do not have sexual intercourse, stimulation, or an orgasm. 8. Do not use tampons, douche, or put anything in the vagina. 9. Do not lift anything over 10 pounds (4.5 kg). 10. Avoid strenuous activity or straining your pelvic muscles. SEEK MEDICAL CARE IF:  You have any vaginal bleeding during pregnancy. Treat this as a potential emergency.  You have cramping pain felt low in the stomach (stronger than menstrual cramps).  You notice vaginal discharge (watery, mucus, or bloody).  You have a low, dull backache.  There are regular contractions or uterine tightening. SEEK IMMEDIATE MEDICAL CARE IF: You have vaginal bleeding and have placenta previa.  Document Released: 06/07/2010 Document Revised: 05/05/2011 Document Reviewed: 06/07/2010 [facility] Patient Information 2015 [place], [place]. This information is not intended to replace advice given to you by your health care provider. Make sure you discuss any questions you have with your health care provider.  Vaginal Bleeding During Pregnancy, Second Trimester A small amount of bleeding (spotting) from the vagina is relatively common in pregnancy. It usually stops on its own. Various things can cause bleeding or spotting in pregnancy. Some  bleeding may be related to the pregnancy, and some may not. Sometimes the bleeding is normal and is not a problem. However, bleeding can also be a sign of something serious. Be sure to tell your health care provider about any vaginal bleeding right away. Some possible causes of vaginal bleeding during the second trimester include: 6. Infection, inflammation, or growths on the cervix.  7. The placenta may be partially or completely covering the opening of the cervix inside the uterus (placenta previa). 8. The placenta may have separated from the uterus (abruption of the placenta).  9. You may be having early (preterm) labor.  10. The cervix may not be strong enough to keep a baby inside the uterus (cervical insufficiency).  11. Tiny cysts may have developed in the uterus instead of pregnancy tissue (molar pregnancy). HOME CARE INSTRUCTIONS  Watch your condition for any changes. The following actions may help to lessen any discomfort you are feeling: 11. Follow your health care provider's instructions for limiting your activity. If your health care provider orders bed rest, you may need to stay in bed and only get up to use the bathroom. However, your health care provider may allow you to continue light activity. 12. If needed, make plans for someone to help with your regular activities and responsibilities while you are on bed rest. 13. Keep track of the number of pads you use each day, how often you change pads, and how soaked (saturated) they are. Write this down. 14. Do not use tampons. Do not douche. 15. Do not have sexual intercourse or orgasms until approved by your health care provider. 16. If you pass  any tissue from your vagina, save the tissue so you can show it to your health care provider. 17. Only take over-the-counter or prescription medicines as directed by your health care provider. 18. Do not take aspirin because it can make you bleed. 19. Do not exercise or perform any  strenuous activities or heavy lifting without your health care provider's permission. 20. Keep all follow-up appointments as directed by your health care provider. SEEK MEDICAL CARE IF:  You have any vaginal bleeding during any part of your pregnancy.  You have cramps or labor pains.  You have a fever, not controlled by medicine. SEEK IMMEDIATE MEDICAL CARE IF:   You have severe cramps in your back or belly (abdomen).  You have contractions.  You have chills.  You pass large clots or tissue from your vagina.  Your bleeding increases.  You feel light-headed or weak, or you have fainting episodes.  You are leaking fluid or have a gush of fluid from your vagina. MAKE SURE YOU:  Understand these instructions.  Will watch your condition.  Will get help right away if you are not doing well or get worse. Document Released: 11/20/2004 Document Revised: 02/15/2013 Document Reviewed: 10/18/2012 [facility] Patient Information 2015 [place], [place]. This information is not intended to replace advice given to you by your health care provider. Make sure you discuss any questions you have with your health care provider. Discharge instructions   You can wash your hair  Shower  Eat what you want  Drink what you want  See me in 6 weeks  Your ankles are going to swell more in the next 2 weeks than when pregnant  No sex for 6 weeks   Briana Wagner A, MD 04/24/2014

## 2014-04-24 NOTE — Discharge Summary (Signed)
  Patient's a 24 year old gravida 2 para 0111 at 6633 weeks who's had a number of previous admissions because of bleeding she did not have a placenta previa and was thought to have him marginal abruption and she came in contracting contractions stopped and bleeding stopped and she was discharged home on bedrest to see me in the week

## 2014-12-03 ENCOUNTER — Other Ambulatory Visit (HOSPITAL_COMMUNITY)
Admission: RE | Admit: 2014-12-03 | Discharge: 2014-12-03 | Disposition: A | Discharge status: Home / Self Care | Payer: Medicaid Other | Source: Ambulatory Visit | Attending: Emergency Medicine | Admitting: Emergency Medicine

## 2014-12-03 ENCOUNTER — Emergency Department (INDEPENDENT_AMBULATORY_CARE_PROVIDER_SITE_OTHER)
Admission: EM | Admit: 2014-12-03 | Discharge: 2014-12-03 | Disposition: A | Discharge status: Home / Self Care | Payer: Medicaid Other | Source: Home / Self Care | Attending: Emergency Medicine | Admitting: Emergency Medicine

## 2014-12-03 ENCOUNTER — Encounter (HOSPITAL_COMMUNITY): Payer: Self-pay | Admitting: *Deleted

## 2014-12-03 DIAGNOSIS — Z202 Contact with and (suspected) exposure to infections with a predominantly sexual mode of transmission: Secondary | ICD-10-CM

## 2014-12-03 DIAGNOSIS — N898 Other specified noninflammatory disorders of vagina: Secondary | ICD-10-CM

## 2014-12-03 DIAGNOSIS — Z113 Encounter for screening for infections with a predominantly sexual mode of transmission: Secondary | ICD-10-CM | POA: Diagnosis present

## 2014-12-03 DIAGNOSIS — N760 Acute vaginitis: Secondary | ICD-10-CM | POA: Insufficient documentation

## 2014-12-03 HISTORY — DX: Acute vaginitis: N76.0

## 2014-12-03 HISTORY — DX: Other specified bacterial agents as the cause of diseases classified elsewhere: B96.89

## 2014-12-03 HISTORY — DX: Chlamydial infection, unspecified: A74.9

## 2014-12-03 MED ORDER — AZITHROMYCIN 250 MG PO TABS
1000.0000 mg | ORAL_TABLET | Freq: Once | ORAL | Status: AC
  Administered 2014-12-03: 1000 mg via ORAL

## 2014-12-03 MED ORDER — AZITHROMYCIN 250 MG PO TABS
ORAL_TABLET | ORAL | Status: AC
  Filled 2014-12-03: qty 4

## 2014-12-03 MED ORDER — CEFTRIAXONE SODIUM 250 MG IJ SOLR
250.0000 mg | Freq: Once | INTRAMUSCULAR | Status: AC
  Administered 2014-12-03: 250 mg via INTRAMUSCULAR

## 2014-12-03 MED ORDER — LIDOCAINE HCL (PF) 1 % IJ SOLN
INTRAMUSCULAR | Status: AC
Start: 2014-12-03 — End: 2014-12-03
  Filled 2014-12-03: qty 5

## 2014-12-03 MED ORDER — CEFTRIAXONE SODIUM 250 MG IJ SOLR
INTRAMUSCULAR | Status: AC
  Filled 2014-12-03: qty 250

## 2014-12-03 NOTE — ED Notes (Signed)
  C/O vaginal discharge x 2 wks; now having some low abd cramping and started spotting this AM.  Diagnosed with chlamydia & BV approx 1 mo ago - was treated for both with resolution of sxs.

## 2014-12-03 NOTE — Discharge Instructions (Signed)
Sexually Transmitted Disease °A sexually transmitted disease (STD) is a disease or infection that may be passed (transmitted) from person to person, usually during sexual activity. This may happen by way of saliva, semen, blood, vaginal mucus, or urine. Common STDs include: °· Gonorrhea. °· Chlamydia. °· Syphilis. °· HIV and AIDS. °· Genital herpes. °· Hepatitis B and C. °· Trichomonas. °· Human papillomavirus (HPV). °· Pubic lice. °· Scabies. °· Mites. °· Bacterial vaginosis. °WHAT ARE CAUSES OF STDs? °An STD may be caused by bacteria, a virus, or parasites. STDs are often transmitted during sexual activity if one person is infected. However, they may also be transmitted through nonsexual means. STDs may be transmitted after:  °· Sexual intercourse with an infected person. °· Sharing sex toys with an infected person. °· Sharing needles with an infected person or using unclean piercing or tattoo needles. °· Having intimate contact with the genitals, mouth, or rectal areas of an infected person. °· Exposure to infected fluids during birth. °WHAT ARE THE SIGNS AND SYMPTOMS OF STDs? °Different STDs have different symptoms. Some people may not have any symptoms. If symptoms are present, they may include: °· Painful or bloody urination. °· Pain in the pelvis, abdomen, vagina, anus, throat, or eyes. °· A skin rash, itching, or irritation. °· Growths, ulcerations, blisters, or sores in the genital and anal areas. °· Abnormal vaginal discharge with or without bad odor. °· Penile discharge in men. °· Fever. °· Pain or bleeding during sexual intercourse. °· Swollen glands in the groin area. °· Yellow skin and eyes (jaundice). This is seen with hepatitis. °· Swollen testicles. °· Infertility. °· Sores and blisters in the mouth. °HOW ARE STDs DIAGNOSED? °To make a diagnosis, your health care provider may: °· Take a medical history. °· Perform a physical exam. °· Take a sample of any discharge to examine. °· Swab the throat,  cervix, opening to the penis, rectum, or vagina for testing. °· Test a sample of your first morning urine. °· Perform blood tests. °· Perform a Pap test, if this applies. °· Perform a colposcopy. °· Perform a laparoscopy. °HOW ARE STDs TREATED? °Treatment depends on the STD. Some STDs may be treated but not cured. °· Chlamydia, gonorrhea, trichomonas, and syphilis can be cured with antibiotic medicine. °· Genital herpes, hepatitis, and HIV can be treated, but not cured, with prescribed medicines. The medicines lessen symptoms. °· Genital warts from HPV can be treated with medicine or by freezing, burning (electrocautery), or surgery. Warts may come back. °· HPV cannot be cured with medicine or surgery. However, abnormal areas may be removed from the cervix, vagina, or vulva. °· If your diagnosis is confirmed, your recent sexual partners need treatment. This is true even if they are symptom-free or have a negative culture or evaluation. They should not have sex until their health care providers say it is okay. °· Your health care provider may test you for infection again 3 months after treatment. °HOW CAN I REDUCE MY RISK OF GETTING AN STD? °Take these steps to reduce your risk of getting an STD: °· Use latex condoms, dental dams, and water-soluble lubricants during sexual activity. Do not use petroleum jelly or oils. °· Avoid having multiple sex partners. °· Do not have sex with someone who has other sex partners °· Do not have sex with anyone you do not know or who is at high risk for an STD. °· Avoid risky sex practices that can break your skin. °· Do not have sex   if you have open sores on your mouth or skin.  Avoid drinking too much alcohol or taking illegal drugs. Alcohol and drugs can affect your judgment and put you in a vulnerable position.  Avoid engaging in oral and anal sex acts.  Get vaccinated for HPV and hepatitis. If you have not received these vaccines in the past, talk to your health care  provider about whether one or both might be right for you.  If you are at risk of being infected with HIV, it is recommended that you take a prescription medicine daily to prevent HIV infection. This is called pre-exposure prophylaxis (PrEP). You are considered at risk if:  You are a man who has sex with other men (MSM).  You are a heterosexual man or woman and are sexually active with more than one partner.  You take drugs by injection.  You are sexually active with a partner who has HIV.  Talk with your health care provider about whether you are at high risk of being infected with HIV. If you choose to begin PrEP, you should first be tested for HIV. You should then be tested every 3 months for as long as you are taking PrEP. WHAT SHOULD I DO IF I THINK I HAVE AN STD?  See your health care provider.  Tell your sexual partner(s). They should be tested and treated for any STDs.  Do not have sex until your health care provider says it is okay. WHEN SHOULD I GET IMMEDIATE MEDICAL CARE? Contact your health care provider right away if:   You have severe abdominal pain.  You are a man and notice swelling or pain in your testicles.  You are a woman and notice swelling or pain in your vagina.   This information is not intended to replace advice given to you by your health care provider. Make sure you discuss any questions you have with your health care provider.  Treating you for Chlamydia today with Zithromax and rocephin. Will call you with test results. Please be mindful of safe sex practices.    Document Released: 05/03/2002 Document Revised: 03/03/2014 Document Reviewed: 08/31/2012 [employer] Interactive Patient Education [employer]

## 2014-12-03 NOTE — ED Provider Notes (Signed)
CSN: [id number]     Arrival date & time 12/03/14  1558 History   First MD Initiated Contact with Patient 12/03/14 1612     Chief Complaint  Patient presents with  . Vaginal Discharge   (Consider location/radiation/quality/duration/timing/severity/associated sxs/prior Treatment) HPI Comments: Patient presents with reoccuring vaginal discharge. She was treated for Chylmadia 6 weeks ago and now the discharge has returned. Mild pelvic "discomfort" and brownish discharge. No Fever or chills. No urinary dysuria or hematuria. Unprotected sex.   The history is provided by the patient.    Past Medical History  Diagnosis Date  . Asthma   . Bronchitis   . Chlamydia   . BV (bacterial vaginosis)    Past Surgical History  Procedure Laterality Date  . Wisdom tooth extraction  2014  . No past surgeries    . Wisdom tooth extraction     Family History  Problem Relation Age of Onset  . Hypertension Mother   . Aneurysm Mother   . Stroke Mother   . Heart disease Father    Social History  Substance Use Topics  . Smoking status: Former Smoker -- 0.25 packs/day for 5 years    Quit date: 09/30/2013  . Smokeless tobacco: Never Used  . Alcohol Use: No   OB History    Gravida Para Term Preterm AB TAB SAB Ectopic Multiple Living   0 1     Review of Systems  All other systems reviewed and are negative.   Allergies  Shellfish allergy  Home Medications   Prior to Admission medications   Medication Sig Start Date End Date Taking? Authorizing Provider  albuterol (PROVENTIL HFA;VENTOLIN HFA) 108 (90 BASE) MCG/ACT inhaler Inhale 2 puffs into the lungs every 6 (six) hours as needed for wheezing or shortness of breath. 11/08/13  Yes Raelyn Mora, CNM   Meds Ordered and Administered this Visit   Medications  cefTRIAXone (ROCEPHIN) injection 250 mg (not administered)  azithromycin (ZITHROMAX) tablet 1,000 mg (not administered)    BP 121/73 mmHg  Pulse 78  Temp(Src) 98.2 F  (36.8 C) (Oral)  Resp 18  SpO2 100%  LMP 11/21/2014 (Exact Date) No data found.   Physical Exam  Constitutional: She is oriented to person, place, and time. She appears well-developed and well-nourished. No distress.  Abdominal: Soft. She exhibits no distension. There is no tenderness. There is no rebound and no guarding.  Genitourinary:  Brown discharge in the vaginal vault. No lesions.   Neurological: She is alert and oriented to person, place, and time.  Skin: Skin is warm and dry. She is not diaphoretic.  Psychiatric: Her behavior is normal.  Nursing note and vitals reviewed.   ED Course  Procedures (including critical care time)  Labs Review Labs Reviewed  CERVICOVAGINAL ANCILLARY ONLY    Imaging Review No results found.   Visual Acuity Review  Right Eye Distance:   Left Eye Distance:   Bilateral Distance:    Right Eye Near:   Left Eye Near:    Bilateral Near:         MDM   1. Vaginal discharge   2. Chlamydia contact    Empiric treatment with Z-max  + Rocephin  IM. Safe sex practices discussed.     Riki Sheer, PA-C 12/03/14 1645

## 2014-12-04 LAB — CERVICOVAGINAL ANCILLARY ONLY
Chlamydia: NEGATIVE
Neisseria Gonorrhea: NEGATIVE
Trichomonas: NEGATIVE

## 2014-12-04 NOTE — ED Notes (Signed)
Final report of STD testing negative for GC, chlamydia. No further action required

## 2014-12-05 LAB — CERVICOVAGINAL ANCILLARY ONLY: Wet Prep (BD Affirm): POSITIVE — AB

## 2014-12-05 MED ORDER — METRONIDAZOLE 500 MG PO TABS: 500.0000 mg | ORAL_TABLET | Freq: Two times a day (BID) | ORAL | Status: DC

## 2014-12-05 NOTE — ED Notes (Signed)
Final report of wet prep positive for gardnerella. Reviewed w Amparo Bristol, who sent electronic Rx to [facility], [place]. Patient called, and after verifying ID, discussed findings, was advised to completed rx as written

## 2015-01-10 ENCOUNTER — Emergency Department (INDEPENDENT_AMBULATORY_CARE_PROVIDER_SITE_OTHER): Payer: Medicaid Other

## 2015-01-10 ENCOUNTER — Emergency Department (INDEPENDENT_AMBULATORY_CARE_PROVIDER_SITE_OTHER)
Admission: EM | Admit: 2015-01-10 | Discharge: 2015-01-10 | Disposition: A | Discharge status: Home / Self Care | Payer: Medicaid Other | Source: Home / Self Care | Attending: Family Medicine | Admitting: Family Medicine

## 2015-01-10 ENCOUNTER — Encounter (HOSPITAL_COMMUNITY): Payer: Self-pay | Admitting: *Deleted

## 2015-01-10 DIAGNOSIS — S93402A Sprain of unspecified ligament of left ankle, initial encounter: Secondary | ICD-10-CM

## 2015-01-10 NOTE — Discharge Instructions (Signed)
Wear ankle support as needed for comfort, activity as tolerated. advil and ice as needed, return or see orthopedist if further problems. °

## 2015-01-10 NOTE — ED Provider Notes (Signed)
CSN: [id number]     Arrival date & time 01/10/15  1635 History   First MD Initiated Contact with Patient 01/10/15 1750     Chief Complaint  Patient presents with  . Ankle Pain   (Consider location/radiation/quality/duration/timing/severity/associated sxs/prior Treatment) Patient is a 24 y.o. female presenting with ankle pain. The history is provided by the patient.  Ankle Pain Location:  Ankle Time since incident:  2 weeks Injury: no   Ankle location:  L ankle Pain details:    Quality:  Sharp   Radiates to:  Does not radiate   Severity:  Mild   Onset quality:  Gradual Chronicity:  New Dislocation: no   Prior injury to area:  No Relieved by:  None tried Worsened by:  Nothing tried Ineffective treatments:  None tried Associated symptoms: stiffness and swelling   Associated symptoms: no decreased ROM     Past Medical History  Diagnosis Date  . Asthma   . Bronchitis   . Chlamydia   . BV (bacterial vaginosis)    Past Surgical History  Procedure Laterality Date  . Wisdom tooth extraction  2014  . No past surgeries    . Wisdom tooth extraction     Family History  Problem Relation Age of Onset  . Hypertension Mother   . Aneurysm Mother   . Stroke Mother   . Heart disease Father    Social History  Substance Use Topics  . Smoking status: Former Smoker -- 0.25 packs/day for 5 years    Quit date: 09/30/2013  . Smokeless tobacco: Never Used  . Alcohol Use: No   OB History    Gravida Para Term Preterm AB TAB SAB Ectopic Multiple Living   2 1  1 1     0 1     Review of Systems  Musculoskeletal: Positive for joint swelling, gait problem and stiffness.  Skin: Negative.   All other systems reviewed and are negative.   Allergies  Shellfish allergy  Home Medications   Prior to Admission medications   Medication Sig Start Date End Date Taking? Authorizing Provider  albuterol (PROVENTIL HFA;VENTOLIN HFA) 108 (90 BASE) MCG/ACT inhaler Inhale 2 puffs into the lungs  every 6 (six) hours as needed for wheezing or shortness of breath. 11/08/13   Rolitta Arita Missawson, CNM  metroNIDAZOLE (FLAGYL) 500 MG tablet Take 1 tablet (500 mg total) by mouth 2 (two) times daily. 12/05/14   Charm RingsErin J Honig, MD   Meds Ordered and Administered this Visit  Medications - No data to display  BP 116/81 mmHg  Pulse 65  Temp(Src) 98 F (36.7 C) (Oral)  Resp 12  SpO2 99%  LMP 01/03/2015 No data found.   Physical Exam  Constitutional: She is oriented to person, place, and time. She appears well-developed and well-nourished. No distress.  Musculoskeletal: She exhibits tenderness. She exhibits no edema.       Left ankle: She exhibits decreased range of motion and swelling. She exhibits no ecchymosis and normal pulse. Tenderness. Lateral malleolus tenderness found. No head of 5th metatarsal and no proximal fibula tenderness found. Achilles tendon normal. Achilles tendon exhibits no pain, no defect and normal Thompson's test results.  Neurological: She is alert and oriented to person, place, and time.  Skin: Skin is warm and dry.  Nursing note and vitals reviewed.   ED Course  Procedures (including critical care time)  Labs Review Labs Reviewed - No data to display  Imaging Review Dg Ankle Complete Left  01/10/2015  CLINICAL  DATA:  Pt complains of lateral and posterior left ankle pain x 3 weeks; No known injury; EXAM: LEFT ANKLE COMPLETE - 3+ VIEW COMPARISON:  None. FINDINGS: There is no evidence of fracture, dislocation, or joint effusion. There is no evidence of arthropathy or other focal bone abnormality. Soft tissues are unremarkable. IMPRESSION: Negative. Electronically Signed   By: Corlis Leak M.D.   On: 01/10/2015 18:12     Visual Acuity Review  Right Eye Distance:   Left Eye Distance:   Bilateral Distance:    Right Eye Near:   Left Eye Near:    Bilateral Near:         MDM   1. Ankle sprain, left, initial encounter        Linna Hoff, MD 01/10/15  [phone]

## 2015-01-10 NOTE — ED Notes (Signed)
Pt  Reports  Pain in l  Ankle      Pt  States   She  Did  Not  Have  A  specefic  Injury  To  The  Affected  Ankle  But  She  Reports  Pain       She  Ambulated  To  The  Room  She  denys any chest pain or  Shortness  Of  Breath     The  Pt reports      That the  Pain is  In  Her  Heel  Area

## 2015-03-22 ENCOUNTER — Emergency Department (HOSPITAL_COMMUNITY)
Admission: EM | Admit: 2015-03-22 | Discharge: 2015-03-22 | Disposition: A | Discharge status: Home / Self Care | Payer: Medicaid Other | Attending: Emergency Medicine | Admitting: Emergency Medicine

## 2015-03-22 ENCOUNTER — Encounter (HOSPITAL_COMMUNITY): Payer: Self-pay | Admitting: *Deleted

## 2015-03-22 DIAGNOSIS — N760 Acute vaginitis: Secondary | ICD-10-CM | POA: Diagnosis not present

## 2015-03-22 DIAGNOSIS — J45909 Unspecified asthma, uncomplicated: Secondary | ICD-10-CM | POA: Insufficient documentation

## 2015-03-22 DIAGNOSIS — B9689 Other specified bacterial agents as the cause of diseases classified elsewhere: Secondary | ICD-10-CM

## 2015-03-22 DIAGNOSIS — Z87891 Personal history of nicotine dependence: Secondary | ICD-10-CM | POA: Diagnosis not present

## 2015-03-22 DIAGNOSIS — Z8619 Personal history of other infectious and parasitic diseases: Secondary | ICD-10-CM | POA: Diagnosis not present

## 2015-03-22 DIAGNOSIS — N939 Abnormal uterine and vaginal bleeding, unspecified: Secondary | ICD-10-CM | POA: Diagnosis present

## 2015-03-22 LAB — WET PREP, GENITAL
Sperm: NONE SEEN
Trich, Wet Prep: NONE SEEN
Yeast Wet Prep HPF POC: NONE SEEN

## 2015-03-22 LAB — BASIC METABOLIC PANEL
Anion gap: 13 (ref 5–15)
BUN: 10 mg/dL (ref 6–20)
CO2: 24 mmol/L (ref 22–32)
Calcium: 10.2 mg/dL (ref 8.9–10.3)
Chloride: 105 mmol/L (ref 101–111)
Creatinine, Ser: 1.06 mg/dL — ABNORMAL HIGH (ref 0.44–1.00)
GFR calc Af Amer: >60 mL/min (ref >60)
GFR calc non Af Amer: >60 mL/min (ref >60)
Glucose, Bld: 79 mg/dL (ref 65–99)
Potassium: 3.9 mmol/L (ref 3.5–5.1)
Sodium: 142 mmol/L (ref 135–145)

## 2015-03-22 LAB — I-STAT BETA HCG BLOOD, ED (MC, WL, AP ONLY): I-stat hCG, quantitative: <5 m[IU]/mL (ref <5)

## 2015-03-22 LAB — CBC
HCT: 39.6 % (ref 36.0–46.0)
Hemoglobin: 13.5 g/dL (ref 12.0–15.0)
MCH: 30.5 pg (ref 26.0–34.0)
MCHC: 34.1 g/dL (ref 30.0–36.0)
MCV: 89.4 fL (ref 78.0–100.0)
Platelets: 260 10*3/uL (ref 150–400)
RBC: 4.43 MIL/uL (ref 3.87–5.11)
RDW: 13.3 % (ref 11.5–15.5)
WBC: 6.3 10*3/uL (ref 4.0–10.5)

## 2015-03-22 MED ORDER — METRONIDAZOLE 500 MG PO TABS: 500.0000 mg | ORAL_TABLET | Freq: Two times a day (BID) | ORAL | Status: DC

## 2015-03-22 MED ORDER — METRONIDAZOLE 500 MG PO TABS
500.0000 mg | ORAL_TABLET | Freq: Once | ORAL | Status: AC
  Administered 2015-03-22: 500 mg via ORAL
  Filled 2015-03-22: qty 1

## 2015-03-22 NOTE — Discharge Instructions (Signed)
Abnormal Uterine Bleeding Abnormal uterine bleeding means bleeding from the vagina that is not your normal menstrual period. This can be:  Bleeding or spotting between periods.  Bleeding after sex (sexual intercourse).  Bleeding that is heavier or more than normal.  Periods that last longer than usual.  Bleeding after menopause. There are many problems that may cause this. Treatment will depend on the cause of the bleeding. Any kind of bleeding that is not normal should be reviewed by your doctor.  HOME CARE Watch your condition for any changes. These actions may lessen any discomfort you are having:  Do not use tampons or douches as told by your doctor.  Change your pads often. You should get regular pelvic exams and Pap tests. Keep all appointments for tests as told by your doctor. GET HELP IF:  You are bleeding for more than 1 week.  You feel dizzy at times. GET HELP RIGHT AWAY IF:   You pass out.  You have to change pads every 15 to 30 minutes.  You have belly pain.  You have a fever.  You become sweaty or weak.  You are passing large blood clots from the vagina.  You feel sick to your stomach (nauseous) and throw up (vomit). MAKE SURE YOU:  Understand these instructions.  Will watch your condition.  Will get help right away if you are not doing well or get worse.   This information is not intended to replace advice given to you by your health care provider. Make sure you discuss any questions you have with your health care provider.   Document Released: 12/08/2008 Document Revised: 02/15/2013 Document Reviewed: 09/09/2012 Elsevier Interactive Patient Education [employer] Your vaginal swab shows that you have bacterial vaginosis.  She will be treated with a medication called Flagyl.  Please take this as directed and all until all tablets have been completed.  Please call your OB/GYN make an appointment for follow-up

## 2015-03-22 NOTE — ED Provider Notes (Addendum)
CSN: [id number]     Arrival date & time 03/22/15  1759 History   First MD Initiated Contact with Patient 03/22/15 2011     Chief Complaint  Patient presents with  . Vaginal Bleeding  . Abdominal Cramping     (Consider location/radiation/quality/duration/timing/severity/associated sxs/prior Treatment) HPI Comments: This is a 25 year old female states that she had a normal menstrual cycle approximately a week ago, lasted the normal length of time 4-5 days with the last day being spotting.  She was bleeding and spotting free for 3 days, when she again started having menstrual bleeding and passing clots with some abdominal cramping.  Denies any discharge prior to her menstrual cycle.  She does not use any hormone replacement therapy.  She does not have a history of abnormal menstrual cycles, but she does have a past history of chlamydia and bacterial vaginosis.  Patient is a 25 y.o. female presenting with vaginal bleeding and cramps. The history is provided by the patient.  Vaginal Bleeding Quality:  Passed tissue Severity:  Moderate Onset quality:  Gradual Duration:  4 days Progression:  Unchanged Chronicity:  New Menstrual history:  Regular Possible pregnancy: no   Context: spontaneously   Relieved by:  Nothing Ineffective treatments:  None tried Associated symptoms: abdominal pain   Associated symptoms: no dysuria, no fever, no nausea and no vaginal discharge   Abdominal Cramping Associated symptoms include abdominal pain. Pertinent negatives include no fever, nausea or vomiting.    Past Medical History  Diagnosis Date  . Asthma   . Bronchitis   . Chlamydia   . BV (bacterial vaginosis)    Past Surgical History  Procedure Laterality Date  . Wisdom tooth extraction  2014  . No past surgeries    . Wisdom tooth extraction     Family History  Problem Relation Age of Onset  . Hypertension Mother   . Aneurysm Mother   . Stroke Mother   . Heart disease Father    Social  History  Substance Use Topics  . Smoking status: Former Smoker -- 0.25 packs/day for 5 years    Quit date: 09/30/2013  . Smokeless tobacco: Never Used  . Alcohol Use: No   OB History    Gravida Para Term Preterm AB TAB SAB Ectopic Multiple Living   0 1     Review of Systems  Constitutional: Negative for fever.  Gastrointestinal: Positive for abdominal pain. Negative for nausea and vomiting.  Genitourinary: Positive for vaginal bleeding and menstrual problem. Negative for dysuria and vaginal discharge.  All other systems reviewed and are negative.     Allergies  Shellfish allergy  Home Medications   Prior to Admission medications   Medication Sig Start Date End Date Taking? Authorizing Provider  Multiple Vitamin (MULTIVITAMIN WITH MINERALS) TABS tablet Take 1 tablet by mouth daily.   Yes Historical Provider, MD  albuterol (PROVENTIL HFA;VENTOLIN HFA) 108 (90 BASE) MCG/ACT inhaler Inhale 2 puffs into the lungs every 6 (six) hours as needed for wheezing or shortness of breath. Patient not taking: Reported on 03/22/2015 11/08/13   Raelyn Mora, CNM  metroNIDAZOLE (FLAGYL) 500 MG tablet Take 1 tablet (500 mg total) by mouth 2 (two) times daily. 03/22/15   Earley Favor, NP   BP 104/59 mmHg  Pulse 73  Temp(Src) 97.5 F (36.4 C) (Oral)  Resp 16  SpO2 100%  LMP 03/15/2015 Physical Exam  Constitutional: She appears well-developed and well-nourished.  HENT:  Head: Normocephalic.  Neck: Normal range of motion.  Cardiovascular: Normal rate.   Pulmonary/Chest: Effort normal.  Abdominal: Soft. She exhibits no distension. There is no tenderness.  Genitourinary: There is bleeding in the vagina.  Musculoskeletal: Normal range of motion.  Neurological: She is alert.  Skin: Skin is warm.  Nursing note and vitals reviewed.   ED Course  Procedures (including critical care time) Labs Review Labs Reviewed  WET PREP, GENITAL - Abnormal; Notable for the following:    Clue  Cells Wet Prep HPF POC PRESENT (*)    WBC, Wet Prep HPF POC MODERATE (*)    All other components within normal limits  BASIC METABOLIC PANEL - Abnormal; Notable for the following:    Creatinine, Ser 1.06 (*)    All other components within normal limits  CBC  I-STAT BETA HCG BLOOD, ED ([facility], [facility], [facility] ONLY)  GC/CHLAMYDIA PROBE AMP ([place]) NOT AT [facility]    Imaging Review No results found. I have personally reviewed and evaluated these images and lab results as part of my medical decision-making.   EKG Interpretation None      MDM   Final diagnoses:  Abnormal uterine and vaginal bleeding, unspecified  Bacterial vaginosis         Earley Favor, NP 03/22/15 2231  Donnetta Hutching, MD 03/23/15 2249  Earley Favor, NP 03/28/15 1947  Donnetta Hutching, MD 04/09/15 1023

## 2015-03-22 NOTE — ED Notes (Signed)
Pt reports having her period last week and not is bleeding again and passing large clots. Pt states that she is having abdominal cramping as well. Pt denies N/V

## 2015-03-22 NOTE — ED Notes (Signed)
Pt ambulating independently w/ steady gait on d/c in no acute distress, A&Ox4. D/c instructions reviewed w/ pt and family - pt and family deny any further questions or concerns at present. Rx given x1  

## 2015-03-23 LAB — GC/CHLAMYDIA PROBE AMP (~~LOC~~) NOT AT ARMC
Chlamydia: NEGATIVE
Neisseria Gonorrhea: NEGATIVE

## 2015-06-03 ENCOUNTER — Inpatient Hospital Stay (HOSPITAL_COMMUNITY)
Admission: AD | Admit: 2015-06-03 | Discharge: 2015-06-03 | Disposition: A | Discharge status: Home / Self Care | Payer: Medicaid Other | Source: Ambulatory Visit | Attending: Obstetrics & Gynecology | Admitting: Obstetrics & Gynecology

## 2015-06-03 ENCOUNTER — Encounter (HOSPITAL_COMMUNITY): Payer: Self-pay

## 2015-06-03 DIAGNOSIS — N760 Acute vaginitis: Secondary | ICD-10-CM | POA: Diagnosis not present

## 2015-06-03 DIAGNOSIS — Z87891 Personal history of nicotine dependence: Secondary | ICD-10-CM | POA: Diagnosis not present

## 2015-06-03 DIAGNOSIS — N940 Mittelschmerz: Secondary | ICD-10-CM | POA: Diagnosis not present

## 2015-06-03 DIAGNOSIS — A499 Bacterial infection, unspecified: Secondary | ICD-10-CM

## 2015-06-03 DIAGNOSIS — B9689 Other specified bacterial agents as the cause of diseases classified elsewhere: Secondary | ICD-10-CM

## 2015-06-03 DIAGNOSIS — N939 Abnormal uterine and vaginal bleeding, unspecified: Secondary | ICD-10-CM | POA: Diagnosis present

## 2015-06-03 LAB — URINALYSIS, ROUTINE W REFLEX MICROSCOPIC
Bilirubin Urine: NEGATIVE
Glucose, UA: NEGATIVE mg/dL
Hgb urine dipstick: NEGATIVE
Ketones, ur: NEGATIVE mg/dL
Leukocytes, UA: NEGATIVE
Nitrite: NEGATIVE
Protein, ur: 100 mg/dL — AB
Specific Gravity, Urine: >1.03 — ABNORMAL HIGH (ref 1.005–1.030)
pH: 6 (ref 5.0–8.0)

## 2015-06-03 LAB — URINE MICROSCOPIC-ADD ON

## 2015-06-03 LAB — WET PREP, GENITAL
Sperm: NONE SEEN
Trich, Wet Prep: NONE SEEN
Yeast Wet Prep HPF POC: NONE SEEN

## 2015-06-03 LAB — POCT PREGNANCY, URINE: Preg Test, Ur: NEGATIVE

## 2015-06-03 MED ORDER — METRONIDAZOLE 500 MG PO TABS: 500.0000 mg | ORAL_TABLET | Freq: Two times a day (BID) | ORAL | Status: DC

## 2015-06-03 NOTE — MAU Provider Note (Signed)
Chief Complaint: Vaginal Bleeding and Abdominal Cramping   First Provider Initiated Contact with Patient 06/03/15 1459     SUBJECTIVE HPI: Briana Wagner is a 25 y.o. G2P0111 female who presents to Maternity Admissions reporting intermenstrual bleeding and mild cramping x 3-4 days. Bleeding started almost as heavy as a period. Has decreased to spotting today. LMP 05/14/15. She states she is in a mutually monogamous relationship.   Location: low abd/groin Quality: cramping Severity: 2/10 on pain scale Duration: 3-4 days Course: improving Timing: intermittent Radiating: Low back Modifying factors: Hasn't needed to try anything for the pain. There are no aggravating or aleviating factors. Associated signs and symptoms: Positive for spotting and vaginal odor. Negative for fever, chills, vaginal discharge, dysuria, urgency, frequency, hematuria, nausea, vomiting, diarrhea, constipation.  Past Medical History  Diagnosis Date  . Asthma   . Bronchitis   . Chlamydia   . BV (bacterial vaginosis)    OB History  Gravida Para Term Preterm AB SAB TAB Ectopic Multiple Living  0 1    # Outcome Date GA Lbr Len/2nd Weight Sex Delivery Anes PTL Lv  2 Preterm 04/14/14 [redacted]w[redacted]d / 00:11 4 lb 1.6 oz (1.86 kg) M Vag-Spont EPI  Y  1 AB              Past Surgical History  Procedure Laterality Date  . Wisdom tooth extraction  2014  . No past surgeries    . Wisdom tooth extraction     Social History   Social History  . Marital Status: Single    Spouse Name: N/A  . Number of Children: N/A  . Years of Education: N/A   Occupational History  . Not on file.   Social History Main Topics  . Smoking status: Former Smoker -- 0.25 packs/day for 5 years    Quit date: 09/30/2013  . Smokeless tobacco: Never Used  . Alcohol Use: No  . Drug Use: No  . Sexual Activity: Yes    Birth Control/ Protection: Condom     Comment: occasional condom use   Other Topics Concern  . Not on file    Social History Narrative   No current facility-administered medications on file prior to encounter.   Current Outpatient Prescriptions on File Prior to Encounter  Medication Sig Dispense Refill  . albuterol (PROVENTIL HFA;VENTOLIN HFA) 108 (90 BASE) MCG/ACT inhaler Inhale 2 puffs into the lungs every 6 (six) hours as needed for wheezing or shortness of breath. (Patient not taking: Reported on 03/22/2015) 1 Inhaler 2   Allergies  Allergen Reactions  . Shellfish Allergy Hives and Swelling    I have reviewed the past Medical Hx, Surgical Hx, Social Hx, Allergies and Medications.   Review of Systems  Constitutional: Negative for fever and chills.  Gastrointestinal: Positive for abdominal pain. Negative for nausea, vomiting, diarrhea, constipation and abdominal distention.  Genitourinary: Positive for vaginal bleeding and menstrual problem. Negative for dysuria, urgency, frequency, hematuria, flank pain, vaginal discharge, vaginal pain and pelvic pain.  Musculoskeletal: Positive for back pain.    OBJECTIVE Patient Vitals for the past 24 hrs:  BP Temp Temp src Pulse Resp Height Weight  06/03/15 1439 106/66 mmHg 98.1 F (36.7 C) Oral 79 18  (1.6 m) 135 lb (61.236 kg)   Constitutional: Well-developed, well-nourished female in no acute distress.  Cardiovascular: normal rate Respiratory: normal rate and effort.  GI: Abd soft, non-tender. Negative for mass, guarding or rebound tenderness. MS:  Extremities nontender, no edema, normal ROM Neurologic: Alert and oriented x 4.  GU: Neg CVAT.  SPECULUM EXAM: NEFG, moderate amount of a mixture of clear, mucoid discharge and small amount of white, creamy discharge with mild odor, no blood noted, cervix clean. No mucopurulent discharge. Cervix nonfriable.  BIMANUAL: cervix closed; uterus normal size, no adnexal tenderness or masses. No CMT.  LAB RESULTS Results for orders placed or performed during the hospital encounter of 06/03/15 (from  the past 24 hour(s))  Pregnancy, urine POC     Status: None   Collection Time: 06/03/15  2:26 PM  Result Value Ref Range   Preg Test, Ur NEGATIVE NEGATIVE  Urinalysis, Routine w reflex microscopic (not [facility])     Status: Abnormal   Collection Time: 06/03/15  2:30 PM  Result Value Ref Range   Color, Urine YELLOW YELLOW   APPearance CLEAR CLEAR   Specific Gravity, Urine >1.030 (H) 1.005 - 1.030   pH 6.0 5.0 - 8.0   Glucose, UA NEGATIVE NEGATIVE mg/dL   Hgb urine dipstick NEGATIVE NEGATIVE   Bilirubin Urine NEGATIVE NEGATIVE   Ketones, ur NEGATIVE NEGATIVE mg/dL   Protein, ur 161100 (A) NEGATIVE mg/dL   Nitrite NEGATIVE NEGATIVE   Leukocytes, UA NEGATIVE NEGATIVE  Urine microscopic-add on     Status: Abnormal   Collection Time: 06/03/15  2:30 PM  Result Value Ref Range   Squamous Epithelial / LPF 0-5 (A) NONE SEEN   WBC, UA 0-5 0 - 5 WBC/hpf   RBC / HPF 0-5 0 - 5 RBC/hpf   Bacteria, UA FEW (A) NONE SEEN  Wet prep, genital     Status: Abnormal   Collection Time: 06/03/15  3:05 PM  Result Value Ref Range   Yeast Wet Prep HPF POC NONE SEEN NONE SEEN   Trich, Wet Prep NONE SEEN NONE SEEN   Clue Cells Wet Prep HPF POC PRESENT (A) NONE SEEN   WBC, Wet Prep HPF POC MODERATE (A) NONE SEEN   Sperm NONE SEEN     IMAGING No results found.  MAU COURSE UPT, UA, wet prep, GC/chlamydia cultures.  MDM - 25 year-old non-pregnant female w/ intermenstrual spotting (resolved) possibly from BV.  - Cramping from mittleschmerz, BV.    ASSESSMENT 1. BV (bacterial vaginosis)   2. Ovulation pain     PLAN Discharge home in stable condition. Rx Flagyl GC/Chlamydia pending List of gynecologist given. Encouraged patient to follow-up in office with gynecologist for further evaluation of this problem in for Pap as she has never had one.     Follow-up Information    Follow up with Gynecologist of your choice.   Why:  For non-emergent gynecology care, Pap smear      [facility].   Why:  As needed in emergencies   Contact information:   [address] [id number] mc [place] [id number] [phone]       Medication List    TAKE these medications        albuterol 108 (90 Base) MCG/ACT inhaler  Commonly known as:  PROVENTIL HFA;VENTOLIN HFA  Inhale 2 puffs into the lungs every 6 (six) hours as needed for wheezing or shortness of breath.     metroNIDAZOLE 500 MG tablet  Commonly known as:  FLAGYL  Take 1 tablet (500 mg total) by mouth 2 (two) times daily.         [place] Zaiah Eckerson, [place] 06/03/2015  3:46 PM

## 2015-06-03 NOTE — MAU Note (Signed)
Pt states she had a normal period on March 20th. Pt states she started having cramping and spotting starting 3 days ago that comes and goes. Pt denies pain currently.

## 2015-06-03 NOTE — Discharge Instructions (Signed)

## 2015-06-04 LAB — GC/CHLAMYDIA PROBE AMP (~~LOC~~) NOT AT ARMC
Chlamydia: NEGATIVE
Neisseria Gonorrhea: NEGATIVE

## 2015-09-25 ENCOUNTER — Ambulatory Visit (HOSPITAL_COMMUNITY)
Admission: EM | Admit: 2015-09-25 | Discharge: 2015-09-25 | Disposition: A | Discharge status: Home / Self Care | Payer: Medicaid Other | Attending: Family Medicine | Admitting: Family Medicine

## 2015-09-25 DIAGNOSIS — G44209 Tension-type headache, unspecified, not intractable: Secondary | ICD-10-CM

## 2015-09-25 MED ORDER — KETOROLAC TROMETHAMINE 60 MG/2ML IM SOLN
INTRAMUSCULAR | Status: AC
  Filled 2015-09-25: qty 2

## 2015-09-25 MED ORDER — NAPROXEN 500 MG PO TABS: ORAL_TABLET | ORAL | 0 refills | Status: DC

## 2015-09-25 MED ORDER — KETOROLAC TROMETHAMINE 60 MG/2ML IM SOLN
60.0000 mg | Freq: Once | INTRAMUSCULAR | Status: AC
  Administered 2015-09-25: 60 mg via INTRAMUSCULAR

## 2015-09-25 NOTE — ED Triage Notes (Signed)
Pt states she has been suffering from chills, sweating, and a headache since yesterday.  Pt states her temperature was around 99 at home.  She has been taking Thera-Flu at home.

## 2015-09-25 NOTE — ED Provider Notes (Signed)
CSN: [id number]     Arrival date & time 09/25/15  1521 History   None    Chief Complaint  Patient presents with  . Chills  . Headache  . Night Sweats   (Consider location/radiation/quality/duration/timing/severity/associated sxs/prior Treatment) Patient c/o headache posterior and some photophobia for one day.  She had to come from work today.  She is having some URI sx's for one day.  She c/o sinus congestion and denies sore throat.    Headache  Pain location:  Occipital Quality:  Dull Radiates to:  Does not radiate Onset quality:  Sudden Duration:  4 hours Timing:  Constant Progression:  Waxing and waning Chronicity:  New Similar to prior headaches: yes   Context: activity and bright light   Relieved by:  None tried Worsened by:  Activity, light, neck movement and sound Ineffective treatments:  None tried Associated symptoms: drainage, fatigue, sinus pressure and URI     Past Medical History:  Diagnosis Date  . Asthma   . Bronchitis   . BV (bacterial vaginosis)   . Chlamydia    Past Surgical History:  Procedure Laterality Date  . NO PAST SURGERIES    . WISDOM TOOTH EXTRACTION  2014  . WISDOM TOOTH EXTRACTION     Family History  Problem Relation Age of Onset  . Hypertension Mother   . Aneurysm Mother   . Stroke Mother   . Heart disease Father    Social History  Substance Use Topics  . Smoking status: Former Smoker    Packs/day: 0.25    Years: 5.00    Quit date: 09/30/2013  . Smokeless tobacco: Never Used  . Alcohol use No   OB History    Gravida Para Term Preterm AB Living   SAB TAB Ectopic Multiple Live Births         0 1     Review of Systems  Constitutional: Positive for chills and fatigue.  HENT: Positive for postnasal drip and sinus pressure.   Eyes: Negative.   Respiratory: Negative.   Cardiovascular: Negative.   Gastrointestinal: Negative.   Endocrine: Negative.   Genitourinary: Negative.   Musculoskeletal: Negative.    Skin: Negative.   Allergic/Immunologic: Negative.   Neurological: Positive for headaches.    Allergies  Shellfish allergy  Home Medications   Prior to Admission medications   Medication Sig Start Date End Date Taking? Authorizing Provider  albuterol (PROVENTIL HFA;VENTOLIN HFA) 108 (90 BASE) MCG/ACT inhaler Inhale 2 puffs into the lungs every 6 (six) hours as needed for wheezing or shortness of breath. Patient not taking: Reported on 03/22/2015 11/08/13   Raelyn Mora, CNM  metroNIDAZOLE (FLAGYL) 500 MG tablet Take 1 tablet (500 mg total) by mouth 2 (two) times daily. 06/03/15   Dorathy Kinsman, CNM  naproxen (NAPROSYN) 500 MG tablet One tablet po bid prn headache 09/25/15   Deatra Canter, FNP   Meds Ordered and Administered this Visit   Medications  ketorolac (TORADOL) injection 60 mg (not administered)    BP 117/70 (BP Location: Right Arm)   Pulse 83   Temp 97.9 F (36.6 C) (Oral)   Resp 12   SpO2 99%  No data found.   Physical Exam  Constitutional: She is oriented to person, place, and time. She appears well-developed and well-nourished.  HENT:  Head: Normocephalic and atraumatic.  Right Ear: External ear normal.  Left Ear: External ear normal.  Mouth/Throat: Oropharynx is clear and moist.  Eyes: Conjunctivae and EOM are normal. Pupils are equal, round, and reactive to light.  Neck: Normal range of motion. Neck supple.  Cardiovascular: Normal rate, regular rhythm, normal heart sounds and intact distal pulses.   Pulmonary/Chest: Effort normal and breath sounds normal.  Abdominal: Soft. Bowel sounds are normal.  Musculoskeletal: She exhibits tenderness.  Tenderness occipital region  Neurological: She is alert and oriented to person, place, and time.  Skin: Skin is warm.  Nursing note and vitals reviewed.   Urgent Care Course   Clinical Course    Procedures (including critical care time)  Labs Review Labs Reviewed - No data to display  Imaging Review No  results found.   Visual Acuity Review  Right Eye Distance:   Left Eye Distance:   Bilateral Distance:    Right Eye Near:   Left Eye Near:    Bilateral Near:         MDM   1. Tension headache   2. Viral URI  Toradol 60mg  IM Naprosyn 500mg  one po bid prn #20 Work note Push po fluids, rest, tylenol and motrin otc prn as directed for fever, arthralgias, and myalgias.  Follow up prn if sx's continue or persist.     Deatra Canter, FNP 09/25/15 1718    Deatra Canter, FNP 09/25/15 [phone]

## 2015-11-21 ENCOUNTER — Ambulatory Visit (INDEPENDENT_AMBULATORY_CARE_PROVIDER_SITE_OTHER): Payer: Self-pay

## 2015-11-21 ENCOUNTER — Encounter (HOSPITAL_COMMUNITY): Payer: Self-pay | Admitting: Emergency Medicine

## 2015-11-21 ENCOUNTER — Ambulatory Visit (HOSPITAL_COMMUNITY)
Admission: EM | Admit: 2015-11-21 | Discharge: 2015-11-21 | Disposition: A | Discharge status: Home / Self Care | Payer: Self-pay | Attending: Internal Medicine | Admitting: Internal Medicine

## 2015-11-21 DIAGNOSIS — M25562 Pain in left knee: Secondary | ICD-10-CM

## 2015-11-21 DIAGNOSIS — M545 Low back pain: Secondary | ICD-10-CM

## 2015-11-21 MED ORDER — IBUPROFEN 800 MG PO TABS
ORAL_TABLET | ORAL | Status: AC
  Filled 2015-11-21: qty 1

## 2015-11-21 MED ORDER — NAPROXEN SODIUM 550 MG PO TABS: 550.0000 mg | ORAL_TABLET | Freq: Two times a day (BID) | ORAL | 0 refills | Status: DC

## 2015-11-21 MED ORDER — IBUPROFEN 800 MG PO TABS
800.0000 mg | ORAL_TABLET | Freq: Once | ORAL | Status: AC
  Administered 2015-11-21: 800 mg via ORAL

## 2015-11-21 MED ORDER — OXYCODONE-ACETAMINOPHEN 10-325 MG PO TABS: 1.0000 | ORAL_TABLET | ORAL | 0 refills | Status: DC | PRN
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
## 2015-11-30 ENCOUNTER — Emergency Department (HOSPITAL_COMMUNITY)
Admission: EM | Admit: 2015-11-30 | Discharge: 2015-12-01 | Disposition: A | Discharge status: Home / Self Care | Payer: Self-pay | Attending: Emergency Medicine | Admitting: Emergency Medicine

## 2015-11-30 ENCOUNTER — Encounter (HOSPITAL_COMMUNITY): Payer: Self-pay | Admitting: Oncology

## 2015-11-30 DIAGNOSIS — S01311A Laceration without foreign body of right ear, initial encounter: Secondary | ICD-10-CM | POA: Insufficient documentation

## 2015-11-30 DIAGNOSIS — Y939 Activity, unspecified: Secondary | ICD-10-CM | POA: Insufficient documentation

## 2015-11-30 DIAGNOSIS — Y929 Unspecified place or not applicable: Secondary | ICD-10-CM | POA: Insufficient documentation

## 2015-11-30 DIAGNOSIS — Y999 Unspecified external cause status: Secondary | ICD-10-CM | POA: Insufficient documentation

## 2015-11-30 DIAGNOSIS — J45909 Unspecified asthma, uncomplicated: Secondary | ICD-10-CM | POA: Insufficient documentation

## 2015-11-30 DIAGNOSIS — S1191XA Laceration without foreign body of unspecified part of neck, initial encounter: Secondary | ICD-10-CM | POA: Insufficient documentation

## 2015-11-30 DIAGNOSIS — Z23 Encounter for immunization: Secondary | ICD-10-CM | POA: Insufficient documentation

## 2015-11-30 DIAGNOSIS — Z87891 Personal history of nicotine dependence: Secondary | ICD-10-CM | POA: Insufficient documentation
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
## 2015-12-01 MED ORDER — BACITRACIN ZINC 500 UNIT/GM EX OINT: 1.0000 "application " | TOPICAL_OINTMENT | Freq: Two times a day (BID) | CUTANEOUS | Status: DC

## 2015-12-01 MED ORDER — CEPHALEXIN 250 MG PO CAPS: 250.0000 mg | ORAL_CAPSULE | Freq: Four times a day (QID) | ORAL | 0 refills | Status: DC

## 2015-12-01 MED ORDER — LIDOCAINE HCL (PF) 1 % IJ SOLN
5.0000 mL | Freq: Once | INTRAMUSCULAR | Status: AC
  Administered 2015-12-01: 5 mL
  Filled 2015-12-01: qty 30

## 2015-12-01 MED ORDER — TETANUS-DIPHTH-ACELL PERTUSSIS 5-2.5-18.5 LF-MCG/0.5 IM SUSP
0.5000 mL | Freq: Once | INTRAMUSCULAR | Status: AC
  Administered 2015-12-01: 0.5 mL via INTRAMUSCULAR
  Filled 2015-12-01: qty 0.5
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
## 2015-12-01 NOTE — Discharge Instructions (Signed)
The sutures should dissolve in the next 5-7 days. Please return if you notice increased pain, swelling, discharge, or fever.

## 2015-12-01 NOTE — ED Notes (Signed)
Patient is alert and oriented x3.  She was given DC instructions and follow up visit instructions.  Patient gave verbal understanding. She was DC ambulatory under her own power to home.  V/S stable.  He was not showing any signs of distress on DC 

## 2015-12-02 ENCOUNTER — Encounter (HOSPITAL_COMMUNITY): Payer: Self-pay | Admitting: Emergency Medicine

## 2015-12-02 ENCOUNTER — Ambulatory Visit (HOSPITAL_COMMUNITY)
Admission: EM | Admit: 2015-12-02 | Discharge: 2015-12-02 | Disposition: A | Discharge status: Home / Self Care | Payer: BLUE CROSS/BLUE SHIELD | Attending: Internal Medicine | Admitting: Internal Medicine

## 2015-12-02 DIAGNOSIS — M25562 Pain in left knee: Secondary | ICD-10-CM
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
## 2015-12-02 NOTE — Discharge Instructions (Addendum)
[facility], [phone], for further evaluation of persistent knee pain.

## 2016-03-10 ENCOUNTER — Emergency Department (HOSPITAL_COMMUNITY)
Admission: EM | Admit: 2016-03-10 | Discharge: 2016-03-10 | Disposition: A | Discharge status: Home / Self Care | Payer: Self-pay | Attending: Emergency Medicine | Admitting: Emergency Medicine

## 2016-03-10 ENCOUNTER — Encounter (HOSPITAL_COMMUNITY): Payer: Self-pay | Admitting: Emergency Medicine

## 2016-03-10 ENCOUNTER — Emergency Department (HOSPITAL_COMMUNITY): Payer: Self-pay

## 2016-03-10 DIAGNOSIS — F17200 Nicotine dependence, unspecified, uncomplicated: Secondary | ICD-10-CM | POA: Insufficient documentation

## 2016-03-10 DIAGNOSIS — Y929 Unspecified place or not applicable: Secondary | ICD-10-CM | POA: Insufficient documentation

## 2016-03-10 DIAGNOSIS — W228XXA Striking against or struck by other objects, initial encounter: Secondary | ICD-10-CM | POA: Insufficient documentation

## 2016-03-10 DIAGNOSIS — S99922A Unspecified injury of left foot, initial encounter: Secondary | ICD-10-CM | POA: Insufficient documentation

## 2016-03-10 DIAGNOSIS — J45909 Unspecified asthma, uncomplicated: Secondary | ICD-10-CM | POA: Insufficient documentation

## 2016-03-10 DIAGNOSIS — Y939 Activity, unspecified: Secondary | ICD-10-CM | POA: Insufficient documentation

## 2016-03-10 DIAGNOSIS — Y999 Unspecified external cause status: Secondary | ICD-10-CM | POA: Insufficient documentation

## 2016-03-10 MED ORDER — IBUPROFEN 800 MG PO TABS: 800.0000 mg | ORAL_TABLET | Freq: Three times a day (TID) | ORAL | 0 refills | Status: DC
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
## 2016-03-10 NOTE — Discharge Instructions (Signed)
Take 500 mg of naproxen every 12 hours or 800 mg of ibuprofen every 8 hours for the next 3 days. Take these medications with food to avoid upset stomach. Follow up with a primary care provider for any future management of these complaints. Use the crutches and the postop shoe as needed to reduce pain and to increase comfort. Elevate the extremity whenever possible. May apply ice to reduce inflammation.

## 2016-03-10 NOTE — ED Notes (Signed)
Papers reviewed with patient and she verbalizes understanding. Post op shoe applied by ortho tech

## 2016-03-10 NOTE — ED Notes (Signed)
Ortho paged for crutches and post op shoe

## 2016-03-10 NOTE — Progress Notes (Signed)
Orthopedic Tech Progress Note Patient Details:  Briana Wagner [birth year] [id number]  Ortho Devices Type of Ortho Device: Crutches, Postop shoe/boot Ortho Device/Splint Location: Applied Post op shoe to left foot. Applied crutches for left foot.  Pt tolerated care and was educated on crutches.  pt ambulated well with crutches. Informed Staff nurse in ED C-pod of completion.  Ortho Device/Splint Interventions: Application, Adjustment   Alvina ChouWilliams, Dmarco Baldus C 03/10/2016, 1:34 PM

## 2016-03-10 NOTE — ED Provider Notes (Signed)
[facility] Provider Note   CSN: [id number] Arrival date & time: 03/10/16  1030  By signing my name below, I, Sonum Patel, attest that this documentation has been prepared under the direction and in the presence of Shawn Joy, PA-C. Electronically Signed: Sonum Patel, Neurosurgeoncribe. 03/10/16. 1:02 PM.  History   Chief Complaint Chief Complaint  Patient presents with  . Toe Injury    The history is provided by the patient. No language interpreter was used.     HPI Comments: Briana Wagner is a 26 y.o. female who presents to the Emergency Department complaining of constant, unchanged, moderate to severe left great toe pain with associated mild swelling that began last night after kicking a chair. She describes the pain as throbbing in nature and is worse with palpation and ambulation. She denies neuro deficits, other injuries, or any other complaints.   Past Medical History:  Diagnosis Date  . Asthma   . Bronchitis   . BV (bacterial vaginosis)   . Chlamydia     Patient Active Problem List   Diagnosis Date Noted  . NVD (normal vaginal delivery) 04/14/2014  . Preterm labor 04/13/2014  . [redacted] weeks gestation of pregnancy   . Vaginal bleeding in pregnancy 04/08/2014  . 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 Vaginal bleeding during pregnancy, antepartum 04/02/2014  . [redacted] weeks gestation of pregnancy 04/02/2014  . Subchorionic hemorrhage in first trimester 11/09/2013    Past Surgical History:  Procedure Laterality Date  . NO PAST SURGERIES    . WISDOM TOOTH EXTRACTION  2014  . WISDOM TOOTH EXTRACTION      OB History    Gravida Para Term Preterm AB Living   2 1   1 1 1    SAB TAB Ectopic Multiple Live Births         0 1       Home Medications    Prior to Admission medications   Medication Sig Start Date End Date Taking? Authorizing Provider  ibuprofen (ADVIL,MOTRIN) 800 MG tablet Take 1 tablet (800 mg total) by mouth 3 (three) times daily. 03/10/16   Shawn C Joy, PA-C    oxyCODONE-acetaminophen (PERCOCET) 10-325 MG tablet Take 1 tablet by mouth every 4 (four) hours as needed for pain. 11/21/15   Tharon AquasFrank C Patrick, PA    Family History Family History  Problem Relation Age of Onset  . Hypertension Mother   . Aneurysm Mother   . Stroke Mother   . Heart disease Father     Social History Social History  Substance Use Topics  . Smoking status: Current Every Day Smoker    Packs/day: 0.25    Years: 5.00    Last attempt to quit: 09/30/2013  . Smokeless tobacco: Never Used  . Alcohol use No     Allergies   Shellfish allergy   Review of Systems Review of Systems  Musculoskeletal: Positive for arthralgias and joint swelling.  Skin: Negative for wound.  Neurological: Negative for weakness and numbness.     Physical Exam Updated Vital Signs BP 114/82   Pulse 77   Temp 98.4 F (36.9 C) (Oral)   Resp 17   Ht 5\' 3"  (1.6 m)   Wt 61.2 kg   SpO2 100%   BMI 23.91 kg/m   Physical Exam  Constitutional: She appears well-developed and well-nourished. No distress.  HENT:  Head: Normocephalic and atraumatic.  Eyes: Conjunctivae are normal.  Neck: Neck supple.  Cardiovascular: Normal rate, regular rhythm and intact distal pulses.  Pulmonary/Chest: Effort normal.  Musculoskeletal: She exhibits tenderness. She exhibits no deformity.  Swelling and tenderness over the distal joint of the left great toe. Motor function is intact. NVI  Neurological: She is alert.  Skin: Skin is warm and dry. Capillary refill takes less than 2 seconds. She is not diaphoretic.  Psychiatric: She has a normal mood and affect. Her behavior is normal.  Nursing note and vitals reviewed.    ED Treatments / Results  DIAGNOSTIC STUDIES: Oxygen Saturation is 100% on RA, normal by my interpretation.    COORDINATION OF CARE: 1:01 PM Discussed treatment plan with pt at bedside and pt agreed to plan.    Labs (all labs ordered are listed, but only abnormal results are  displayed) Labs Reviewed - No data to display  EKG  EKG Interpretation None       Radiology Dg Foot Complete Left  Result Date: 03/10/2016 CLINICAL DATA:  Left great toe injury after kicking a chair today for. Swelling and redness. EXAM: LEFT FOOT - COMPLETE 3+ VIEW COMPARISON:  None. FINDINGS: There is no evidence of fracture or dislocation. Small osteophyte at the base of the distal phalanx of the great toe is not a fracture. There is no evidence of arthropathy or other focal bone abnormality. Soft tissues are unremarkable. IMPRESSION: Negative. Electronically Signed   By: Francene Boyers M.D.   On: 03/10/2016 11:54    Procedures Procedures (including critical care time)  Medications Ordered in ED Medications - No data to display   Initial Impression / Assessment and Plan / ED Course  I have reviewed the triage vital signs and the nursing notes.  Pertinent labs & imaging results that were available during my care of the patient were reviewed by me and considered in my medical decision making (see chart for details).  Clinical Course     Patient presents with a toe injury. No acute abnormalities on x-ray. Postop shoe and crutches provided for comfort. PCP follow-up for further management.  Vitals:   03/10/16 1106 03/10/16 1350  BP: 119/76 114/82  Pulse: 85 77  Resp: 18 17  Temp: 98.7 F (37.1 C) 98.4 F (36.9 C)  TempSrc: Oral Oral  SpO2: 100% 100%  Weight: 61.2 kg   Height: 5\' 3"  (1.6 m)      Final Clinical Impressions(s) / ED Diagnoses   Final diagnoses:  Injury of toe on left foot, initial encounter    New Prescriptions Discharge Medication List as of 03/10/2016  1:38 PM    START taking these medications   Details  ibuprofen (ADVIL,MOTRIN) 800 MG tablet Take 1 tablet (800 mg total) by mouth 3 (three) times daily., Starting Mon 03/10/2016, Print       I personally performed the services described in this documentation, which was scribed in my presence.  The recorded information has been reviewed and is accurate.   Anselm Pancoast, PA-C 03/11/16 5409    Canary Brim Tegeler, MD 03/11/16 [phone]

## 2016-03-19 ENCOUNTER — Encounter: Payer: Self-pay | Admitting: *Deleted

## 2016-10-29 ENCOUNTER — Emergency Department (HOSPITAL_COMMUNITY)
Admission: EM | Admit: 2016-10-29 | Discharge: 2016-10-30 | Discharge status: Against Medical Advice | Payer: BLUE CROSS/BLUE SHIELD

## 2016-10-30 ENCOUNTER — Encounter (HOSPITAL_COMMUNITY): Payer: Self-pay | Admitting: *Deleted

## 2016-10-30 ENCOUNTER — Emergency Department (HOSPITAL_COMMUNITY)
Admission: EM | Admit: 2016-10-30 | Discharge: 2016-10-30 | Disposition: A | Discharge status: Home / Self Care | Payer: BLUE CROSS/BLUE SHIELD | Attending: Emergency Medicine | Admitting: Emergency Medicine

## 2016-10-30 DIAGNOSIS — Z5321 Procedure and treatment not carried out due to patient leaving prior to being seen by health care provider: Secondary | ICD-10-CM | POA: Insufficient documentation

## 2016-10-30 DIAGNOSIS — R21 Rash and other nonspecific skin eruption: Secondary | ICD-10-CM | POA: Insufficient documentation

## 2016-10-30 MED ORDER — LIDOCAINE-EPINEPHRINE (PF) 2 %-1:200000 IJ SOLN
10.0000 mL | Freq: Once | INTRAMUSCULAR | Status: DC
  Filled 2016-10-30: qty 20

## 2016-10-30 NOTE — ED Notes (Signed)
Pt is not in room on this rn assessment.  Pt gown is on the chair and it appears that she has left.

## 2016-10-30 NOTE — ED Triage Notes (Signed)
pt states that she has an "ingrown hair" in her rt groin area. Pt denies drainage. Reports an approx "walnut" size area.

## 2016-10-30 NOTE — ED Notes (Signed)
Patient not in room, gown on bed 

## 2017-07-22 ENCOUNTER — Other Ambulatory Visit: Payer: Self-pay

## 2017-07-22 ENCOUNTER — Encounter (HOSPITAL_COMMUNITY): Payer: Self-pay | Admitting: Emergency Medicine

## 2017-07-22 ENCOUNTER — Ambulatory Visit (HOSPITAL_COMMUNITY)
Admission: EM | Admit: 2017-07-22 | Discharge: 2017-07-22 | Disposition: A | Discharge status: Home / Self Care | Payer: BLUE CROSS/BLUE SHIELD | Attending: Family Medicine | Admitting: Family Medicine

## 2017-07-22 DIAGNOSIS — N898 Other specified noninflammatory disorders of vagina: Secondary | ICD-10-CM

## 2017-07-22 LAB — POCT PREGNANCY, URINE: Preg Test, Ur: NEGATIVE

## 2017-07-22 MED ORDER — METRONIDAZOLE 500 MG PO TABS: 500.0000 mg | ORAL_TABLET | Freq: Two times a day (BID) | ORAL | 0 refills | Status: DC

## 2017-07-22 NOTE — ED Provider Notes (Signed)
[facility]    CSN: [id number] Arrival date & time: 07/22/17  1001     History   Chief Complaint Chief Complaint  Patient presents with  . Vaginal Discharge    HPI ERICHA Wagner is a 27 y.o. female.   27 year old female comes in for 29-month history of vaginal discharge.  She denies vaginal itching or pain.  States discharge can have a odor to it intermittently.  She has had abdominal cramps intermittently without nausea or vomiting.  Denies urinary symptoms such as frequency, dysuria, hematuria.  She is sexually active with one female partner, no condom use.  No birth control use.  LMP 06/24/2017.  States she does change her body wash regularly for different scents.  States she has had BV multiple times, and this current episode feels like it.  States she came in today as she has time to, no significant changes to the past 3 months.     Past Medical History:  Diagnosis Date  . Asthma   . Bronchitis   . BV (bacterial vaginosis)   . Chlamydia     Patient Active Problem List   Diagnosis Date Noted  . NVD (normal vaginal delivery) 04/14/2014  . Preterm labor 04/13/2014  . [redacted] weeks gestation of pregnancy   . Vaginal bleeding in pregnancy 04/08/2014  . 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 Vaginal bleeding during pregnancy, antepartum 04/02/2014  . [redacted] weeks gestation of pregnancy 04/02/2014  . Subchorionic hemorrhage in first trimester 11/09/2013    Past Surgical History:  Procedure Laterality Date  . NO PAST SURGERIES    . WISDOM TOOTH EXTRACTION  2014  . WISDOM TOOTH EXTRACTION      OB History    Gravida  2   Para  1   Term      Preterm  1   AB  1   Living  1     SAB      TAB      Ectopic      Multiple  0   Live Births  1            Home Medications    Prior to Admission medications   Medication Sig Start Date End Date Taking? Authorizing Provider  ibuprofen (ADVIL,MOTRIN) 800 MG tablet Take 1 tablet (800 mg total) by mouth 3  (three) times daily. 03/10/16   Joy, Shawn C, PA-C  metroNIDAZOLE (FLAGYL) 500 MG tablet Take 1 tablet (500 mg total) by mouth 2 (two) times daily. 07/22/17   Belinda Fisher, PA-C  oxyCODONE-acetaminophen (PERCOCET) 10-325 MG tablet Take 1 tablet by mouth every 4 (four) hours as needed for pain. 11/21/15   Tharon Aquas, PA    Family History Family History  Problem Relation Age of Onset  . Hypertension Mother   . Aneurysm Mother   . Stroke Mother   . Heart disease Father     Social History Social History   Tobacco Use  . Smoking status: Current Every Day Smoker    Packs/day: 0.25    Years: 5.00    Pack years: 1.25    Last attempt to quit: 09/30/2013    Years since quitting: 3.8  . Smokeless tobacco: Never Used  Substance Use Topics  . Alcohol use: No  . Drug use: No     Allergies   Shellfish allergy   Review of Systems Review of Systems  Reason unable to perform ROS: See HPI as above.     Physical  Exam Triage Vital Signs ED Triage Vitals  Enc Vitals Group     BP 07/22/17 1014 119/87     Pulse Rate 07/22/17 1014 77     Resp 07/22/17 1014 18     Temp 07/22/17 1014 97.8 F (36.6 C)     Temp Source 07/22/17 1014 Oral     SpO2 07/22/17 1014 100 %     Weight --      Height --      Head Circumference --      Peak Flow --      Pain Score 07/22/17 1013 0     Pain Loc --      Pain Edu? --      Excl. in GC? --    No data found.  Updated Vital Signs BP 119/87 (BP Location: Right Arm)   Pulse 77   Temp 97.8 F (36.6 C) (Oral)   Resp 18   LMP 06/24/2017   SpO2 100%   Physical Exam  Constitutional: She is oriented to person, place, and time. She appears well-developed and well-nourished. No distress.  HENT:  Head: Normocephalic and atraumatic.  Eyes: Pupils are equal, round, and reactive to light. Conjunctivae are normal.  Cardiovascular: Normal rate, regular rhythm and normal heart sounds. Exam reveals no gallop and no friction rub.  No murmur  heard. Pulmonary/Chest: Effort normal and breath sounds normal. She has no wheezes. She has no rales.  Abdominal: Soft. Bowel sounds are normal. She exhibits no mass. There is no tenderness. There is no rebound, no guarding and no CVA tenderness.  Neurological: She is alert and oriented to person, place, and time.  Skin: Skin is warm and dry.  Psychiatric: She has a normal mood and affect. Her behavior is normal. Judgment normal.     UC Treatments / Results  Labs (all labs ordered are listed, but only abnormal results are displayed) Labs Reviewed  POCT PREGNANCY, URINE  URINE CYTOLOGY ANCILLARY ONLY    EKG None  Radiology No results found.  Procedures Procedures (including critical care time)  Medications Ordered in UC Medications - No data to display  Initial Impression / Assessment and Plan / UC Course  I have reviewed the triage vital signs and the nursing notes.  Pertinent labs & imaging results that were available during my care of the patient were reviewed by me and considered in my medical decision making (see chart for details).    Patient was treated empirically for BV. Flagyl as directed. Cytology sent, patient will be contacted with any positive results that require additional treatment. Patient to refrain from sexual activity for the next 7 days. Return precautions given.   Final Clinical Impressions(s) / UC Diagnoses   Final diagnoses:  Vaginal discharge    ED Prescriptions    Medication Sig Dispense Auth. Provider   metroNIDAZOLE (FLAGYL) 500 MG tablet Take 1 tablet (500 mg total) by mouth 2 (two) times daily. 14 tablet Threasa Alpha, [place] 07/22/17 1048

## 2017-07-22 NOTE — ED Triage Notes (Signed)
Vaginal discharge for 3 months

## 2017-07-22 NOTE — Discharge Instructions (Addendum)
You were treated empirically for BV. Start flagyl as directed. Cytology sent, you will be contacted with any positive results that requires further treatment. Refrain from sexual activity and alcohol use for the next 7 days.  Monitor for any worsening of symptoms, fever, abdominal pain, nausea, vomiting, to follow up for reevaluation. 

## 2017-07-23 LAB — URINE CYTOLOGY ANCILLARY ONLY
Chlamydia: NEGATIVE
Neisseria Gonorrhea: NEGATIVE
Trichomonas: NEGATIVE

## 2017-07-24 ENCOUNTER — Telehealth (HOSPITAL_COMMUNITY): Payer: Self-pay

## 2017-07-24 LAB — URINE CYTOLOGY ANCILLARY ONLY: Candida vaginitis: NEGATIVE

## 2017-07-24 NOTE — Telephone Encounter (Signed)
Bacterial Vaginosis test is positive.  Prescription for metronidazole was given at the urgent care visit. Pt contacted regarding results. Answered all questions. Verbalized understanding.   

## 2017-10-23 ENCOUNTER — Inpatient Hospital Stay (HOSPITAL_COMMUNITY)
Admission: AD | Admit: 2017-10-23 | Discharge: 2017-10-23 | Discharge status: Against Medical Advice | Payer: BLUE CROSS/BLUE SHIELD | Source: Ambulatory Visit | Attending: Obstetrics and Gynecology | Admitting: Obstetrics and Gynecology

## 2017-10-23 ENCOUNTER — Encounter (HOSPITAL_COMMUNITY): Payer: Self-pay | Admitting: *Deleted

## 2017-10-23 ENCOUNTER — Other Ambulatory Visit: Payer: Self-pay

## 2017-10-23 DIAGNOSIS — O99331 Smoking (tobacco) complicating pregnancy, first trimester: Secondary | ICD-10-CM | POA: Insufficient documentation

## 2017-10-23 DIAGNOSIS — Z3201 Encounter for pregnancy test, result positive: Secondary | ICD-10-CM | POA: Diagnosis not present

## 2017-10-23 DIAGNOSIS — Z823 Family history of stroke: Secondary | ICD-10-CM | POA: Insufficient documentation

## 2017-10-23 DIAGNOSIS — Z791 Long term (current) use of non-steroidal anti-inflammatories (NSAID): Secondary | ICD-10-CM | POA: Insufficient documentation

## 2017-10-23 DIAGNOSIS — R109 Unspecified abdominal pain: Secondary | ICD-10-CM | POA: Insufficient documentation

## 2017-10-23 DIAGNOSIS — Z5321 Procedure and treatment not carried out due to patient leaving prior to being seen by health care provider: Secondary | ICD-10-CM | POA: Diagnosis not present

## 2017-10-23 DIAGNOSIS — Z8249 Family history of ischemic heart disease and other diseases of the circulatory system: Secondary | ICD-10-CM | POA: Insufficient documentation

## 2017-10-23 DIAGNOSIS — O26891 Other specified pregnancy related conditions, first trimester: Secondary | ICD-10-CM | POA: Insufficient documentation

## 2017-10-23 DIAGNOSIS — O208 Other hemorrhage in early pregnancy: Secondary | ICD-10-CM

## 2017-10-23 DIAGNOSIS — Z825 Family history of asthma and other chronic lower respiratory diseases: Secondary | ICD-10-CM | POA: Diagnosis not present

## 2017-10-23 DIAGNOSIS — Z8709 Personal history of other diseases of the respiratory system: Secondary | ICD-10-CM | POA: Insufficient documentation

## 2017-10-23 DIAGNOSIS — O26899 Other specified pregnancy related conditions, unspecified trimester: Secondary | ICD-10-CM

## 2017-10-23 DIAGNOSIS — Z5320 Procedure and treatment not carried out because of patient's decision for unspecified reasons: Secondary | ICD-10-CM

## 2017-10-23 DIAGNOSIS — F17210 Nicotine dependence, cigarettes, uncomplicated: Secondary | ICD-10-CM | POA: Diagnosis not present

## 2017-10-23 DIAGNOSIS — Z9889 Other specified postprocedural states: Secondary | ICD-10-CM | POA: Diagnosis not present

## 2017-10-23 DIAGNOSIS — Z3A01 Less than 8 weeks gestation of pregnancy: Secondary | ICD-10-CM | POA: Insufficient documentation

## 2017-10-23 DIAGNOSIS — Z5329 Procedure and treatment not carried out because of patient's decision for other reasons: Secondary | ICD-10-CM

## 2017-10-23 DIAGNOSIS — Z79891 Long term (current) use of opiate analgesic: Secondary | ICD-10-CM | POA: Diagnosis not present

## 2017-10-23 DIAGNOSIS — Z91013 Allergy to seafood: Secondary | ICD-10-CM | POA: Insufficient documentation

## 2017-10-23 DIAGNOSIS — O209 Hemorrhage in early pregnancy, unspecified: Secondary | ICD-10-CM

## 2017-10-23 DIAGNOSIS — O4691 Antepartum hemorrhage, unspecified, first trimester: Secondary | ICD-10-CM | POA: Insufficient documentation

## 2017-10-23 DIAGNOSIS — Z79899 Other long term (current) drug therapy: Secondary | ICD-10-CM | POA: Insufficient documentation

## 2017-10-23 LAB — URINALYSIS, ROUTINE W REFLEX MICROSCOPIC
Bacteria, UA: NONE SEEN
Bilirubin Urine: NEGATIVE
Glucose, UA: NEGATIVE mg/dL
Hgb urine dipstick: NEGATIVE
Ketones, ur: NEGATIVE mg/dL
Nitrite: NEGATIVE
Protein, ur: 30 mg/dL — AB
Specific Gravity, Urine: 1.027 (ref 1.005–1.030)
pH: 5 (ref 5.0–8.0)

## 2017-10-23 LAB — POCT PREGNANCY, URINE: Preg Test, Ur: POSITIVE — AB

## 2017-10-23 NOTE — MAU Note (Signed)
abd pain and spotting, on and off for past 2 wks. Cycle is late, has not done a home test.

## 2017-10-23 NOTE — MAU Provider Note (Signed)
History     CSN: [id number]  Arrival date and time: 10/23/17 1645   First Provider Initiated Contact with Patient 10/23/17 1721      Chief Complaint  Patient presents with  . Vaginal Bleeding  . Abdominal Pain  . Possible Pregnancy   HPI Briana Wagner is a 27 y.o. [phone] at [redacted]w[redacted]d who presents with abdominal pain and vaginal bleeding. She rates the pain a 4/10 and has not tried anything for the pain. She states the bleeding is only when she wipes. She states she missed her last period but has not taken a pregnancy test.   OB History    Gravida  4   Para  1   Term      Preterm  1   AB  2   Living  1     SAB      TAB  2   Ectopic      Multiple  0   Live Births  1           Past Medical History:  Diagnosis Date  . Asthma   . Bronchitis   . BV (bacterial vaginosis)   . Chlamydia     Past Surgical History:  Procedure Laterality Date  . abortion     x2  . WISDOM TOOTH EXTRACTION  2014  . WISDOM TOOTH EXTRACTION      Family History  Problem Relation Age of Onset  . Hypertension Mother   . Aneurysm Mother   . Stroke Mother   . Heart disease Father        3 MIs, stints  . Hypertension Father   . COPD Father     Social History   Tobacco Use  . Smoking status: Current Every Day Smoker    Packs/day: 0.25    Years: 9.00    Pack years: 2.25    Types: Cigarettes  . Smokeless tobacco: Never Used  Substance Use Topics  . Alcohol use: No  . Drug use: No    Allergies:  Allergies  Allergen Reactions  . Shellfish Allergy Hives and Swelling    Medications Prior to Admission  Medication Sig Dispense Refill Last Dose  . ibuprofen (ADVIL,MOTRIN) 800 MG tablet Take 1 tablet (800 mg total) by mouth 3 (three) times daily. 21 tablet 0 Unknown at Unknown time  . metroNIDAZOLE (FLAGYL) 500 MG tablet Take 1 tablet (500 mg total) by mouth 2 (two) times daily. 14 tablet 0   . oxyCODONE-acetaminophen (PERCOCET) 10-325 MG tablet Take 1 tablet by mouth  every 4 (four) hours as needed for pain. 30 tablet 0 Unknown at Unknown time    Review of Systems  Constitutional: Negative.  Negative for fatigue and fever.  HENT: Negative.   Respiratory: Negative.  Negative for shortness of breath.   Cardiovascular: Negative.  Negative for chest pain.  Gastrointestinal: Positive for abdominal pain. Negative for constipation, diarrhea, nausea and vomiting.  Genitourinary: Positive for vaginal bleeding. Negative for dysuria.  Neurological: Negative.  Negative for dizziness and headaches.   Physical Exam   Blood pressure 115/71, pulse 87, temperature 98.3 F (36.8 C), temperature source Oral, resp. rate 16, weight 68.5 kg, last menstrual period 09/15/2017, SpO2 100 %, unknown if currently breastfeeding.  Physical Exam  MAU Course  Procedures Results for orders placed or performed during the hospital encounter of 10/23/17 (from the past 24 hour(s))  Pregnancy, urine POC     Status: Abnormal   Collection Time: 10/23/17  5:18 PM  Result Value Ref Range   Preg Test, Ur POSITIVE (A) NEGATIVE   MDM UA, UPT CBC, HCG Wet prep and gc/chlamydia US OB Comp Less 14 weeks with Transvaginal  Patient left AMA before physical exam or lab work could be done.   Assessment and Plan   1. Left against medical advice   2. Abdominal pain affecting pregnancy   3. Vaginal bleeding affecting early pregnancy   4. Positive pregnancy test    -Left AMA  Rolm BookbinderCaroline M Ritvik Mczeal CNM 10/23/2017, 5:21 PM

## 2017-10-24 ENCOUNTER — Inpatient Hospital Stay (HOSPITAL_COMMUNITY)
Admission: AD | Admit: 2017-10-24 | Discharge: 2017-10-24 | Disposition: A | Discharge status: Home / Self Care | Payer: BLUE CROSS/BLUE SHIELD | Attending: Obstetrics and Gynecology | Admitting: Obstetrics and Gynecology

## 2017-10-24 ENCOUNTER — Encounter (HOSPITAL_COMMUNITY): Payer: Self-pay

## 2017-10-24 DIAGNOSIS — O99511 Diseases of the respiratory system complicating pregnancy, first trimester: Secondary | ICD-10-CM | POA: Diagnosis not present

## 2017-10-24 DIAGNOSIS — R109 Unspecified abdominal pain: Secondary | ICD-10-CM | POA: Diagnosis present

## 2017-10-24 DIAGNOSIS — J45909 Unspecified asthma, uncomplicated: Secondary | ICD-10-CM | POA: Insufficient documentation

## 2017-10-24 DIAGNOSIS — O26891 Other specified pregnancy related conditions, first trimester: Secondary | ICD-10-CM | POA: Insufficient documentation

## 2017-10-24 DIAGNOSIS — Z3A01 Less than 8 weeks gestation of pregnancy: Secondary | ICD-10-CM | POA: Insufficient documentation

## 2017-10-24 DIAGNOSIS — O99331 Smoking (tobacco) complicating pregnancy, first trimester: Secondary | ICD-10-CM | POA: Diagnosis not present

## 2017-10-24 DIAGNOSIS — Z711 Person with feared health complaint in whom no diagnosis is made: Secondary | ICD-10-CM

## 2017-10-24 LAB — URINALYSIS, ROUTINE W REFLEX MICROSCOPIC
Bilirubin Urine: NEGATIVE
Glucose, UA: NEGATIVE mg/dL
Hgb urine dipstick: NEGATIVE
Ketones, ur: 5 mg/dL — AB
Leukocytes, UA: NEGATIVE
Nitrite: NEGATIVE
Protein, ur: NEGATIVE mg/dL
Specific Gravity, Urine: 1.025 (ref 1.005–1.030)
pH: 5 (ref 5.0–8.0)

## 2017-10-24 LAB — WET PREP, GENITAL
Clue Cells Wet Prep HPF POC: NONE SEEN
Sperm: NONE SEEN
Yeast Wet Prep HPF POC: NONE SEEN

## 2017-10-24 NOTE — MAU Note (Addendum)
[name] is a 27 y.o. at 8319w4d here in MAU reporting: "the same thing as yesterday"  Patient remained on phone during attempt to triage. States she is back to get her swabs, blood work and ultrasound that she didn't get yesterday because she had to leave +lower abdominal pain +vaginal bleeding; spotting; pink in color +possible pregnancy Pain score: 3/10 Vitals:   10/24/17 1647  BP: 131/90  Pulse: 83  Resp: 16  Temp: 98.1 F (36.7 C)  SpO2: 100%      Lab orders placed from triage: ua

## 2017-10-24 NOTE — MAU Note (Signed)
Pt reports she needs to leave to take care of her son, informed pt that we would not call with any results, pt signed AMA form

## 2017-10-24 NOTE — MAU Provider Note (Signed)
History    G3 P1-0-1-1 with LMP of 724 and today wanting to know how far along pregnant she is.  She states she was here yesterday with abdominal pain and cramping and spotting but today feels no bleeding no pain.  Patient's medical history is positive for asthma.  Surgical history is positive for wisdom teeth removal. CSN: [id number]  Arrival date & time 10/24/17  1609   None     Chief Complaint  Patient presents with  . Vaginal Bleeding  . Abdominal Pain  . Possible Pregnancy    HPI  Past Medical History:  Diagnosis Date  . Asthma   . Bronchitis   . BV (bacterial vaginosis)   . Chlamydia     Past Surgical History:  Procedure Laterality Date  . abortion     x2  . WISDOM TOOTH EXTRACTION  2014  . WISDOM TOOTH EXTRACTION      Family History  Problem Relation Age of Onset  . Hypertension Mother   . Aneurysm Mother   . Stroke Mother   . Heart disease Father        3 MIs, stints  . Hypertension Father   . COPD Father     Social History   Tobacco Use  . Smoking status: Current Every Day Smoker    Packs/day: 0.25    Years: 9.00    Pack years: 2.25    Types: Cigarettes  . Smokeless tobacco: Never Used  Substance Use Topics  . Alcohol use: No  . Drug use: No    OB History    Gravida  4   Para  1   Term      Preterm  1   AB  2   Living  1     SAB      TAB  2   Ectopic      Multiple  0   Live Births  1           Review of Systems  Constitutional: Negative.   HENT: Negative.   Eyes: Negative.   Respiratory: Negative.   Cardiovascular: Negative.   Gastrointestinal: Negative.   Endocrine: Negative.   Genitourinary: Negative.   Musculoskeletal: Negative.   Skin: Negative.   Allergic/Immunologic: Negative.   Neurological: Negative.   Hematological: Negative.   Psychiatric/Behavioral: Negative.     Allergies  Shellfish allergy  Home Medications   Current Outpatient Rx  . Order #: [id number] Class: Print  . Order #:  [id number] Class: Normal  . Order #: [id number] Class: Print    BP 131/90 (BP Location: Right Arm)   Pulse 83   Temp 98.1 F (36.7 C) (Oral)   Resp 16   Wt 69.9 kg   LMP 09/15/2017   SpO2 100% Comment: ra  BMI 27.29 kg/m   Physical Exam  Constitutional: She is oriented to person, place, and time. She appears well-developed and well-nourished.  HENT:  Head: Normocephalic.  Cardiovascular: Normal rate, regular rhythm and normal heart sounds.  Pulmonary/Chest: Effort normal and breath sounds normal.  Abdominal: Normal appearance and bowel sounds are normal.  Genitourinary: Vagina normal and uterus normal.  Neurological: She is alert and oriented to person, place, and time.  Skin: Skin is warm and dry.  Psychiatric: She has a normal mood and affect. Her behavior is normal.    MAU Course  Procedures (including critical care time)  Labs Reviewed  WET PREP, GENITAL - Abnormal; Notable for the following components:      Result Value  Trich, Wet Prep PRESENT (*)    WBC, Wet Prep HPF POC MODERATE (*)    All other components within normal limits  URINALYSIS, ROUTINE W REFLEX MICROSCOPIC - Abnormal; Notable for the following components:   Ketones, ur 5 (*)    All other components within normal limits  WET PREP, GENITAL  HCG, QUANTITATIVE, PREGNANCY  GC/CHLAMYDIA PROBE AMP ([place]   No results found.   1. Physically well but worried       MDM  Vital signs are stable.  Patient is having no active bleeding on admission with exam.  Wet prep obtained.  Quant drawn.  Cultures obtained. Patient left AMA

## 2017-10-27 LAB — GC/CHLAMYDIA PROBE AMP (~~LOC~~) NOT AT ARMC
Chlamydia: NEGATIVE
Neisseria Gonorrhea: NEGATIVE

## 2017-11-12 ENCOUNTER — Encounter (HOSPITAL_COMMUNITY): Payer: Self-pay | Admitting: Emergency Medicine

## 2017-11-12 ENCOUNTER — Ambulatory Visit (HOSPITAL_COMMUNITY)
Admission: EM | Admit: 2017-11-12 | Discharge: 2017-11-12 | Disposition: A | Discharge status: Home / Self Care | Payer: BLUE CROSS/BLUE SHIELD | Attending: Family Medicine | Admitting: Family Medicine

## 2017-11-12 DIAGNOSIS — Z8249 Family history of ischemic heart disease and other diseases of the circulatory system: Secondary | ICD-10-CM | POA: Insufficient documentation

## 2017-11-12 DIAGNOSIS — Z7251 High risk heterosexual behavior: Secondary | ICD-10-CM

## 2017-11-12 DIAGNOSIS — F17210 Nicotine dependence, cigarettes, uncomplicated: Secondary | ICD-10-CM | POA: Insufficient documentation

## 2017-11-12 DIAGNOSIS — Z825 Family history of asthma and other chronic lower respiratory diseases: Secondary | ICD-10-CM | POA: Diagnosis not present

## 2017-11-12 DIAGNOSIS — Z91013 Allergy to seafood: Secondary | ICD-10-CM | POA: Insufficient documentation

## 2017-11-12 DIAGNOSIS — N898 Other specified noninflammatory disorders of vagina: Secondary | ICD-10-CM

## 2017-11-12 DIAGNOSIS — Z823 Family history of stroke: Secondary | ICD-10-CM | POA: Insufficient documentation

## 2017-11-12 MED ORDER — CEFTRIAXONE SODIUM 250 MG IJ SOLR
INTRAMUSCULAR | Status: AC
  Filled 2017-11-12: qty 250

## 2017-11-12 MED ORDER — AZITHROMYCIN 250 MG PO TABS
ORAL_TABLET | ORAL | Status: AC
  Filled 2017-11-12: qty 4

## 2017-11-12 MED ORDER — METRONIDAZOLE 500 MG PO TABS: 500.0000 mg | ORAL_TABLET | Freq: Two times a day (BID) | ORAL | 0 refills | Status: DC

## 2017-11-12 MED ORDER — FLUCONAZOLE 200 MG PO TABS: ORAL_TABLET | ORAL | 0 refills | Status: DC

## 2017-11-12 MED ORDER — CEFTRIAXONE SODIUM 250 MG IJ SOLR
250.0000 mg | Freq: Once | INTRAMUSCULAR | Status: AC
  Administered 2017-11-12: 250 mg via INTRAMUSCULAR

## 2017-11-12 MED ORDER — AZITHROMYCIN 250 MG PO TABS
1000.0000 mg | ORAL_TABLET | Freq: Once | ORAL | Status: AC
  Administered 2017-11-12: 1000 mg via ORAL

## 2017-11-12 NOTE — ED Provider Notes (Signed)
[facility] CARE CENTER   [id number] 11/12/17 Arrival Time: 0947   MV:HQIONGE DISCHARGE  SUBJECTIVE:  Briana Wagner is a 27 y.o. female who presents with complaints of vaginal discharge that began 1 month ago.  Last unprotected sex 1 month ago.  Patient is sexually active with 1 female partner.  Describes discharge as thick and chunky.  She has NOT tried OTC medications.  Denies aggravating factors.  She reports similar symptoms in the past and was diagnosed with BV and yeast. Complains of associated nausea, vaginal itching, and vaginal odor.  She denies fever, chills, nausea, vomiting, abdominal or pelvic pain, urinary symptoms, vaginal bleeding, dyspareunia, vaginal rashes or lesions.   Patient's last menstrual period was 09/15/2017 (lmp unknown).  Was pregnant.  Had abortion 10/31/17.    ROS: As per HPI.  Past Medical History:  Diagnosis Date  . Asthma   . Bronchitis   . BV (bacterial vaginosis)   . Chlamydia    Past Surgical History:  Procedure Laterality Date  . abortion     x2  . WISDOM TOOTH EXTRACTION  2014  . WISDOM TOOTH EXTRACTION     Allergies  Allergen Reactions  . Shellfish Allergy Hives and Swelling   No current facility-administered medications on file prior to encounter.    Current Outpatient Medications on File Prior to Encounter  Medication Sig Dispense Refill  . ibuprofen (ADVIL,MOTRIN) 800 MG tablet Take 1 tablet (800 mg total) by mouth 3 (three) times daily. 21 tablet 0    Social History   Socioeconomic History  . Marital status: Single    Spouse name: Not on file  . Number of children: Not on file  . Years of education: Not on file  . Highest education level: Not on file  Occupational History  . Not on file  Social Needs  . Financial resource strain: Not on file  . Food insecurity:    Worry: Not on file    Inability: Not on file  . Transportation needs:    Medical: Not on file    Non-medical: Not on file  Tobacco Use  . Smoking status:  Current Every Day Smoker    Packs/day: 0.25    Years: 9.00    Pack years: 2.25    Types: Cigarettes  . Smokeless tobacco: Never Used  Substance and Sexual Activity  . Alcohol use: No  . Drug use: No  . Sexual activity: Yes  Lifestyle  . Physical activity:    Days per week: Not on file    Minutes per session: Not on file  . Stress: Not on file  Relationships  . Social connections:    Talks on phone: Not on file    Gets together: Not on file    Attends religious service: Not on file    Active member of club or organization: Not on file    Attends meetings of clubs or organizations: Not on file    Relationship status: Not on file  . Intimate partner violence:    Fear of current or ex partner: Not on file    Emotionally abused: Not on file    Physically abused: Not on file    Forced sexual activity: Not on file  Other Topics Concern  . Not on file  Social History Narrative  . Not on file   Family History  Problem Relation Age of Onset  . Hypertension Mother   . Aneurysm Mother   . Stroke Mother   . Heart disease  Father        3 MIs, stints  . Hypertension Father   . COPD Father     OBJECTIVE:  Vitals:   11/12/17 1122  BP: 123/80  Pulse: (!) 58  Resp: 18  Temp: 98.6 F (37 C)  TempSrc: Oral  SpO2: 100%     General appearance: alert, cooperative, appears stated age and no distress Throat: lips, mucosa, and tongue normal; teeth and gums normal Lungs: CTA bilaterally without adventitious breath sounds Heart: regular rate and rhythm.  Radial pulses 2+ symmetrical bilaterally Back: no CVA tenderness Abdomen: soft, non-tender; bowel sounds normal; no guarding GU: deferred Skin: warm and dry Psychological:  Alert and cooperative. Normal mood and affect.   ASSESSMENT & PLAN:  1. Vaginal discharge   2. Unprotected sex     Meds ordered this encounter  Medications  . metroNIDAZOLE (FLAGYL) 500 MG tablet    Sig: Take 1 tablet (500 mg total) by mouth 2 (two)  times daily.    Dispense:  14 tablet    Refill:  0    Order Specific Question:   Supervising Provider    Answer:   Isa RankinMURRAY, LAURA WILSON [phone][[id number]]  . fluconazole (DIFLUCAN) 200 MG tablet    Sig: Take one dose by mouth, wait 72 hours, and then take second dose by mouth    Dispense:  2 tablet    Refill:  0    Order Specific Question:   Supervising Provider    Answer:   Isa RankinMURRAY, LAURA WILSON [[id number]]  . cefTRIAXone (ROCEPHIN) injection 250 mg  . azithromycin (ZITHROMAX) tablet 1,000 mg    Pending: Labs Reviewed  CERVICOVAGINAL ANCILLARY ONLY    Given rocephin 250mg  injection and azithromycin 1g in office Vaginal self swab obtained Prescribed metronidazole 500 mg twice daily for 7 days (do not take while consuming alcohol) Prescribed diflucan 200 mg once daily and then second dose 72 hours later Take medications as prescribed and to completion We will follow up with you regarding the results of your test If tests are positive, please abstain from sexual activity for at least 7 days and notify partners Follow up with PCP if symptoms persists Return here or go to ER if you have any new or worsening symptoms    Reviewed expectations re: course of current medical issues. Questions answered. Outlined signs and symptoms indicating need for more acute intervention. Patient verbalized understanding. After Visit Summary given.       Rennis HardingWurst, Dahlila Pfahler, PA-C 11/12/17 1147

## 2017-11-12 NOTE — ED Triage Notes (Signed)
Pt here for vaginal discharge  

## 2017-11-12 NOTE — Discharge Instructions (Addendum)
Given rocephin 250mg  injection and azithromycin 1g in office Vaginal self swab obtained Prescribed metronidazole 500 mg twice daily for 7 days (do not take while consuming alcohol) Prescribed diflucan 200 mg once daily and then second dose 72 hours later Take medications as prescribed and to completion We will follow up with you regarding the results of your test If tests are positive, please abstain from sexual activity for at least 7 days and notify partners Follow up with PCP if symptoms persists Return here or go to ER if you have any new or worsening symptoms

## 2017-11-13 ENCOUNTER — Telehealth (HOSPITAL_COMMUNITY): Payer: Self-pay

## 2017-11-13 LAB — CERVICOVAGINAL ANCILLARY ONLY
Bacterial vaginitis: NEGATIVE
Candida vaginitis: POSITIVE — AB
Chlamydia: NEGATIVE
Neisseria Gonorrhea: NEGATIVE
Trichomonas: POSITIVE — AB

## 2017-11-13 NOTE — Telephone Encounter (Signed)
Candida (yeast) is positive.  Prescription for fluconazole was given at the urgent care visit.  Pt called and made aware. Recheck or followup with PCP for further evaluation if symptoms are not improving. Answered all questions.  Trichomonas is positive. Rx metronidazole was given at the urgent care visit. Pt contacted and made aware, educated to please refrain from sexual intercourse for 7 days to give the medicine time to work. Sexual partners need to be notified and tested/treated. Condoms may reduce risk of reinfection. Recheck for further evaluation if symptoms are not improving. Answered all questions.

## 2018-01-30 ENCOUNTER — Emergency Department (HOSPITAL_COMMUNITY)
Admission: EM | Admit: 2018-01-30 | Discharge: 2018-01-30 | Disposition: A | Discharge status: Home / Self Care | Payer: BLUE CROSS/BLUE SHIELD | Attending: Emergency Medicine | Admitting: Emergency Medicine

## 2018-01-30 ENCOUNTER — Encounter (HOSPITAL_COMMUNITY): Payer: Self-pay

## 2018-01-30 ENCOUNTER — Other Ambulatory Visit: Payer: Self-pay

## 2018-01-30 DIAGNOSIS — N898 Other specified noninflammatory disorders of vagina: Secondary | ICD-10-CM | POA: Diagnosis present

## 2018-01-30 DIAGNOSIS — J45909 Unspecified asthma, uncomplicated: Secondary | ICD-10-CM | POA: Insufficient documentation

## 2018-01-30 DIAGNOSIS — B3731 Acute candidiasis of vulva and vagina: Secondary | ICD-10-CM

## 2018-01-30 DIAGNOSIS — N760 Acute vaginitis: Secondary | ICD-10-CM | POA: Insufficient documentation

## 2018-01-30 DIAGNOSIS — M545 Low back pain, unspecified: Secondary | ICD-10-CM

## 2018-01-30 DIAGNOSIS — F17210 Nicotine dependence, cigarettes, uncomplicated: Secondary | ICD-10-CM | POA: Insufficient documentation

## 2018-01-30 DIAGNOSIS — B9689 Other specified bacterial agents as the cause of diseases classified elsewhere: Secondary | ICD-10-CM | POA: Diagnosis not present

## 2018-01-30 DIAGNOSIS — B373 Candidiasis of vulva and vagina: Secondary | ICD-10-CM | POA: Insufficient documentation

## 2018-01-30 LAB — WET PREP, GENITAL
Sperm: NONE SEEN
Trich, Wet Prep: NONE SEEN
Yeast Wet Prep HPF POC: NONE SEEN

## 2018-01-30 LAB — URINALYSIS, ROUTINE W REFLEX MICROSCOPIC
Bilirubin Urine: NEGATIVE
Glucose, UA: NEGATIVE mg/dL
Hgb urine dipstick: NEGATIVE
Ketones, ur: 5 mg/dL — AB
Nitrite: NEGATIVE
Protein, ur: NEGATIVE mg/dL
Specific Gravity, Urine: 1.027 (ref 1.005–1.030)
pH: 5 (ref 5.0–8.0)

## 2018-01-30 LAB — POC URINE PREG, ED: Preg Test, Ur: NEGATIVE

## 2018-01-30 MED ORDER — FLUCONAZOLE 150 MG PO TABS
150.0000 mg | ORAL_TABLET | Freq: Once | ORAL | Status: AC
  Administered 2018-01-30: 150 mg via ORAL
  Filled 2018-01-30: qty 1

## 2018-01-30 MED ORDER — METRONIDAZOLE 500 MG PO TABS: 500.0000 mg | ORAL_TABLET | Freq: Two times a day (BID) | ORAL | 0 refills | Status: DC

## 2018-01-30 MED ORDER — FLUCONAZOLE 150 MG PO TABS: 150.0000 mg | ORAL_TABLET | Freq: Once | ORAL | 0 refills | Status: AC

## 2018-01-30 MED ORDER — AZITHROMYCIN 250 MG PO TABS
1000.0000 mg | ORAL_TABLET | Freq: Once | ORAL | Status: AC
  Administered 2018-01-30: 1000 mg via ORAL
  Filled 2018-01-30: qty 4

## 2018-01-30 MED ORDER — CEFTRIAXONE SODIUM 250 MG IJ SOLR
250.0000 mg | Freq: Once | INTRAMUSCULAR | Status: AC
  Administered 2018-01-30: 250 mg via INTRAMUSCULAR
  Filled 2018-01-30: qty 250

## 2018-01-30 MED ORDER — STERILE WATER FOR INJECTION IJ SOLN
INTRAMUSCULAR | Status: AC
  Administered 2018-01-30: 0.9 mL
  Filled 2018-01-30: qty 10

## 2018-01-30 NOTE — Discharge Instructions (Signed)
Your work up today showed that you have bacterial vaginosis, take flagyl as directed until completed; DO NOT DRINK ALCOHOL WHILE TAKING THIS MEDICATION. You were treated for an yeast infection today, if you continue to have symptoms after finishing the other antibiotic then take the second dose of Diflucan (yeast infection medication). Alternate between tylenol and motrin as needed for pain. Use ice or heat to the areas of soreness, no more than 20 minutes per hour for each.   You have been treated for gonorrhea and chlamydia in the ER but the hospital will call you if lab is positive. You were tested for HIV and Syphilis, and the hospital will call you if the lab is positive. NO SEXUAL INTERCOURSE FOR AT LEAST 10 DAYS AFTER TODAY'S VISIT, THIS WILL INVALIDATE YOUR TREATMENT HERE. DO NOT ENGAGE IN SEXUAL ACTIVITY UNTIL YOU FIND OUT ABOUT YOUR RESULTS AND HAVE PARTNERS TESTED AND TREATED. ALL PARTNERS MUST BE TESTED AND TREATED FOR STD'S. ALWAYS USE CONDOMS WHEN ENGAGING IN INTERCOURSE. [facility] for future STD concerns or screenings.  Follow up with your regular doctor in 1 week for recheck of symptoms. [facility]) for any changes or worsening symptoms.

## 2018-01-30 NOTE — ED Provider Notes (Signed)
[facility]-EMERGENCY DEPT Provider Note   CSN: [id number] Arrival date & time: 01/30/18  1237     History   Chief Complaint Chief Complaint  Patient presents with  . Back Pain  . Vaginal Itching  . Vaginal Discharge    HPI Briana Wagner is a 27 y.o. female with a PMHx of asthma, BV, remote chlamydia, and other conditions listed below, who presents to the ED with complaints of 2 weeks of intermittent left lower back pain as well as clear white vaginal discharge and occasional vaginal itching.  Patient states that she ended her menstrual cycle and continued having vaginal discharge.  She describes her pain as 4/10 intermittent fullness in the left lower back which is nonradiating no known aggravating or alleviating factors, she has not tried anything for her symptoms.  She is not sure whether the 2 symptoms are related, but she states that the lower back pain feels "like it is internal".  She came here for evaluation to make sure "that her kidneys were okay".  She is sexually active with 2 female partners, occasionally unprotected.  She denies any recent heavy lifting.  She also denies fevers, chills, CP, SOB, abd pain, N/V/D/C, hematuria, dysuria, urinary frequency, incontinence of urine/stool, saddle anesthesia/cauda equina symptoms, vaginal bleeding, genital sores, myalgias, arthralgias, numbness, tingling, focal weakness, or any other complaints at this time.   The history is provided by the patient and medical records. No language interpreter was used.  Back Pain   Pertinent negatives include no chest pain, no fever, no numbness, no abdominal pain, no dysuria and no weakness.  Vaginal Itching  Pertinent negatives include no chest pain, no abdominal pain and no shortness of breath.  Vaginal Discharge   Pertinent negatives include no fever, no abdominal pain, no constipation, no diarrhea, no nausea, no vomiting, no dysuria and no frequency.    Past Medical  History:  Diagnosis Date  . Asthma   . Bronchitis   . BV (bacterial vaginosis)   . Chlamydia     Patient Active Problem List   Diagnosis Date Noted  . NVD (normal vaginal delivery) 04/14/2014  . Preterm labor 04/13/2014  . [redacted] weeks gestation of pregnancy   . Vaginal bleeding in pregnancy 04/08/2014  . 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 Vaginal bleeding during pregnancy, antepartum 04/02/2014  . [redacted] weeks gestation of pregnancy 04/02/2014  . Subchorionic hemorrhage in first trimester 11/09/2013    Past Surgical History:  Procedure Laterality Date  . abortion     x2  . WISDOM TOOTH EXTRACTION  2014  . WISDOM TOOTH EXTRACTION       OB History    Gravida  4   Para  1   Term      Preterm  1   AB  2   Living  1     SAB      TAB  2   Ectopic      Multiple  0   Live Births  1            Home Medications    Prior to Admission medications   Medication Sig Start Date End Date Taking? Authorizing Provider  fluconazole (DIFLUCAN) 200 MG tablet Take one dose by mouth, wait 72 hours, and then take second dose by mouth 11/12/17   Wurst, [place], PA-C  ibuprofen (ADVIL,MOTRIN) 800 MG tablet Take 1 tablet (800 mg total) by mouth 3 (three) times daily. 03/10/16   Joy, Hillard Danker, PA-C  metroNIDAZOLE (FLAGYL) 500 MG tablet Take 1 tablet (500 mg total) by mouth 2 (two) times daily. 11/12/17   Rennis Harding, PA-C    Family History Family History  Problem Relation Age of Onset  . Hypertension Mother   . Aneurysm Mother   . Stroke Mother   . Heart disease Father        3 MIs, stints  . Hypertension Father   . COPD Father     Social History Social History   Tobacco Use  . Smoking status: Current Every Day Smoker    Packs/day: 0.25    Years: 9.00    Pack years: 2.25    Types: Cigarettes  . Smokeless tobacco: Never Used  Substance Use Topics  . Alcohol use: No  . Drug use: No     Allergies   Shellfish allergy   Review of Systems Review of Systems    Constitutional: Negative for chills and fever.  Respiratory: Negative for shortness of breath.   Cardiovascular: Negative for chest pain.  Gastrointestinal: Negative for abdominal pain, constipation, diarrhea, nausea and vomiting.  Genitourinary: Positive for vaginal discharge and vaginal pain (itching). Negative for dysuria, frequency, genital sores, hematuria and vaginal bleeding.  Musculoskeletal: Positive for back pain. Negative for arthralgias and myalgias.  Skin: Negative for color change.  Allergic/Immunologic: Negative for immunocompromised state.  Neurological: Negative for weakness and numbness.  Psychiatric/Behavioral: Negative for confusion.   All other systems reviewed and are negative for acute change except as noted in the HPI.    Physical Exam Updated Vital Signs BP (!) 124/91 (BP Location: Left Arm)   Pulse 67   Temp 99.5 F (37.5 C) (Oral)   Resp 18   Wt 66.7 kg   LMP 09/15/2017   SpO2 100%   BMI 26.04 kg/m   Physical Exam  Constitutional: She is oriented to person, place, and time. Vital signs are normal. She appears well-developed and well-nourished.  Non-toxic appearance. No distress.  Afebrile, nontoxic, NAD  HENT:  Head: Normocephalic and atraumatic.  Mouth/Throat: Oropharynx is clear and moist and mucous membranes are normal.  Eyes: Conjunctivae and EOM are normal. Right eye exhibits no discharge. Left eye exhibits no discharge.  Neck: Normal range of motion. Neck supple.  Cardiovascular: Normal rate, regular rhythm, normal heart sounds and intact distal pulses. Exam reveals no gallop and no friction rub.  No murmur heard. Pulmonary/Chest: Effort normal and breath sounds normal. No respiratory distress. She has no decreased breath sounds. She has no wheezes. She has no rhonchi. She has no rales.  Abdominal: Soft. Normal appearance and bowel sounds are normal. She exhibits no distension. There is no tenderness. There is no rigidity, no rebound, no  guarding, no CVA tenderness, no tenderness at McBurney's point and negative Murphy's sign.  Soft, NTND, +BS throughout, no r/g/r, neg murphy's, neg mcburney's, no CVA TTP   Genitourinary: Uterus normal. Pelvic exam was performed with patient supine. There is no rash, tenderness or lesion on the right labia. There is no rash, tenderness or lesion on the left labia. Cervix exhibits discharge. Cervix exhibits no motion tenderness and no friability. Right adnexum displays no mass, no tenderness and no fullness. Left adnexum displays no mass, no tenderness and no fullness. No erythema, tenderness or bleeding in the vagina. Vaginal discharge found.  Genitourinary Comments: Chaperone present for exam. No rashes, lesions, or tenderness to external genitalia. No erythema, injury, or tenderness to vaginal mucosa. Moderate amount of white somewhat thick vaginal discharge without bleeding  within vaginal vault. No adnexal masses, tenderness, or fullness. No CMT or cervical friability, +white discharge from cervical os. Cervical os is closed. Uterus non-deviated, mobile, nonTTP, and without enlargement.    Musculoskeletal: Normal range of motion.       Lumbar back: She exhibits normal range of motion, no tenderness, no bony tenderness, no deformity and no spasm.  Lumbar spine with FROM intact without spinous process TTP, no bony stepoffs or deformities, no paraspinous muscle TTP or muscle spasms. No overlying skin changes. Strength and sensation grossly intact in all extremities, gait steady and nonantalgic. Distal pulses intact.   Neurological: She is alert and oriented to person, place, and time. She has normal strength. No sensory deficit.  Skin: Skin is warm, dry and intact. No rash noted.  Psychiatric: She has a normal mood and affect.  Nursing note and vitals reviewed.    ED Treatments / Results  Labs (all labs ordered are listed, but only abnormal results are displayed) Labs Reviewed  WET PREP, GENITAL -  Abnormal; Notable for the following components:      Result Value   Clue Cells Wet Prep HPF POC PRESENT (*)    WBC, Wet Prep HPF POC MODERATE (*)    All other components within normal limits  URINALYSIS, ROUTINE W REFLEX MICROSCOPIC - Abnormal; Notable for the following components:   APPearance HAZY (*)    Ketones, ur 5 (*)    Leukocytes, UA TRACE (*)    Bacteria, UA FEW (*)    All other components within normal limits  RPR  HIV ANTIBODY (ROUTINE TESTING W REFLEX)  POC URINE PREG, ED  GC/CHLAMYDIA PROBE AMP ([place]) NOT AT [facility]    EKG None  Radiology No results found.  Procedures Procedures (including critical care time)  Medications Ordered in ED Medications  azithromycin (ZITHROMAX) tablet 1,000 mg (1,000 mg Oral Given 01/30/18 1702)    And  cefTRIAXone (ROCEPHIN) injection 250 mg (250 mg Intramuscular Given 01/30/18 1659)  fluconazole (DIFLUCAN) tablet 150 mg (150 mg Oral Given 01/30/18 1703)  sterile water (preservative free) injection (0.9 mLs  Given 01/30/18 1703)     Initial Impression / Assessment and Plan / ED Course  I have reviewed the triage vital signs and the nursing notes.  Pertinent labs & imaging results that were available during my care of the patient were reviewed by me and considered in my medical decision making (see chart for details).     27 y.o. female here with intermittent L lower back pain and vaginal itching/discharge x2 wks. On exam, no midline spinal TTP, no reproducible tenderness to the back or abdomen. Will proceed with pelvic exam to further evaluate her complaints. Will get STD panel and U/A, Upreg. Will reassess shortly.   4:27 PM Upreg neg. Pelvic exam reveals thickish white vaginal discharge in vaginal vault, coming from cervical os; no CMT or adnexal tenderness. Will empirically treat for GC/CT and yeast given her symptoms. Doubt need for imaging at this time. Will await U/A and wet prep and reassess shortly.   5:20 PM Wet prep  with +clue cells. U/A unremarkable except for being grossly contaminated with 21-50 squamous, but no evidence of UTI. Overall, BV and yeast vaginitis could be the source of her pain; doubt PID given lack of pelvic tenderness during exam; doubt UTI or pyelo, doubt other emergent pathology. Back pain could also be just musculoskeletal. Will send home with flagyl and diflucan dose to take if symptoms persist at end  of flagyl course; advised tylenol/motrin/OTC remedies for symptomatic relief. F/up with PCP in 1wk for recheck. Discussed abstinence x10 days. F/up with health dept for future STD concerns. Safe sex encouraged, and discussed having partners tested and treated before re-engaging in intercourse after the 10 day abstinence period.  I explained the diagnosis and have given explicit precautions to return to the ER including for any other new or worsening symptoms. The patient understands and accepts the medical plan as it's been dictated and I have answered their questions. Discharge instructions concerning home care and prescriptions have been given. The patient is STABLE and is discharged to home in good condition.    Final Clinical Impressions(s) / ED Diagnoses   Final diagnoses:  BV (bacterial vaginosis)  Acute left-sided low back pain without sciatica  Yeast vaginitis    ED Discharge Orders         Ordered    metroNIDAZOLE (FLAGYL) 500 MG tablet  2 times daily     01/30/18 1720    fluconazole (DIFLUCAN) 150 MG tablet   Once     01/30/18 [address]1720           Shaneika Rossa, [place]Mercedes, New JerseyPA-C 01/30/18 1720    Raeford RazorKohut, Stephen, MD 01/30/18 1754

## 2018-01-30 NOTE — ED Triage Notes (Signed)
Pt reports lower left sided back pain beginning 2 weeks ago intermittently. Pt reports vaginal discharge and itching for 1 week. Pt unsure if the two are related. Pt denies urinary symptoms.

## 2018-01-31 LAB — HIV ANTIBODY (ROUTINE TESTING W REFLEX): HIV Screen 4th Generation wRfx: NONREACTIVE

## 2018-01-31 LAB — RPR: RPR Ser Ql: NONREACTIVE

## 2018-02-01 LAB — GC/CHLAMYDIA PROBE AMP (~~LOC~~) NOT AT ARMC
Chlamydia: NEGATIVE
Neisseria Gonorrhea: NEGATIVE

## 2018-03-23 ENCOUNTER — Emergency Department (HOSPITAL_COMMUNITY)
Admission: EM | Admit: 2018-03-23 | Discharge: 2018-03-24 | Disposition: A | Discharge status: Home / Self Care | Payer: BLUE CROSS/BLUE SHIELD | Attending: Emergency Medicine | Admitting: Emergency Medicine

## 2018-03-23 ENCOUNTER — Encounter (HOSPITAL_COMMUNITY): Payer: Self-pay | Admitting: *Deleted

## 2018-03-23 ENCOUNTER — Other Ambulatory Visit: Payer: Self-pay

## 2018-03-23 DIAGNOSIS — J029 Acute pharyngitis, unspecified: Secondary | ICD-10-CM | POA: Insufficient documentation

## 2018-03-23 DIAGNOSIS — Z5321 Procedure and treatment not carried out due to patient leaving prior to being seen by health care provider: Secondary | ICD-10-CM | POA: Insufficient documentation

## 2018-03-23 NOTE — ED Triage Notes (Signed)
Pt reports sore throat and pain in the roof of her mouth x 4 days.  Groove-like lesions noted in the roof of her mouth.  She describes pain as "cramping pain" that causes a h/a.  Denies any fever.

## 2018-03-24 NOTE — ED Notes (Signed)
Patient called a third time with no answer. 

## 2018-06-17 ENCOUNTER — Encounter (HOSPITAL_COMMUNITY): Payer: Self-pay

## 2018-06-17 ENCOUNTER — Emergency Department (HOSPITAL_COMMUNITY)
Admission: EM | Admit: 2018-06-17 | Discharge: 2018-06-17 | Disposition: A | Discharge status: Home / Self Care | Payer: Self-pay | Attending: Emergency Medicine | Admitting: Emergency Medicine

## 2018-06-17 ENCOUNTER — Other Ambulatory Visit: Payer: Self-pay

## 2018-06-17 DIAGNOSIS — Y998 Other external cause status: Secondary | ICD-10-CM | POA: Insufficient documentation

## 2018-06-17 DIAGNOSIS — Y929 Unspecified place or not applicable: Secondary | ICD-10-CM | POA: Insufficient documentation

## 2018-06-17 DIAGNOSIS — X58XXXA Exposure to other specified factors, initial encounter: Secondary | ICD-10-CM | POA: Insufficient documentation

## 2018-06-17 DIAGNOSIS — T148XXA Other injury of unspecified body region, initial encounter: Secondary | ICD-10-CM

## 2018-06-17 DIAGNOSIS — F17210 Nicotine dependence, cigarettes, uncomplicated: Secondary | ICD-10-CM | POA: Insufficient documentation

## 2018-06-17 DIAGNOSIS — Y9389 Activity, other specified: Secondary | ICD-10-CM | POA: Insufficient documentation

## 2018-06-17 DIAGNOSIS — J45909 Unspecified asthma, uncomplicated: Secondary | ICD-10-CM | POA: Insufficient documentation

## 2018-06-17 DIAGNOSIS — S8012XA Contusion of left lower leg, initial encounter: Secondary | ICD-10-CM | POA: Insufficient documentation

## 2018-06-17 DIAGNOSIS — S8011XA Contusion of right lower leg, initial encounter: Secondary | ICD-10-CM | POA: Insufficient documentation

## 2018-06-17 LAB — CBC WITH DIFFERENTIAL/PLATELET
Abs Immature Granulocytes: 0.01 10*3/uL (ref 0.00–0.07)
Basophils Absolute: 0 10*3/uL (ref 0.0–0.1)
Basophils Relative: 0 %
Eosinophils Absolute: 0.2 10*3/uL (ref 0.0–0.5)
Eosinophils Relative: 3 %
HCT: 39.6 % (ref 36.0–46.0)
Hemoglobin: 12.8 g/dL (ref 12.0–15.0)
Immature Granulocytes: 0 %
Lymphocytes Relative: 35 %
Lymphs Abs: 1.9 10*3/uL (ref 0.7–4.0)
MCH: 29 pg (ref 26.0–34.0)
MCHC: 32.3 g/dL (ref 30.0–36.0)
MCV: 89.8 fL (ref 80.0–100.0)
Monocytes Absolute: 0.6 10*3/uL (ref 0.1–1.0)
Monocytes Relative: 11 %
Neutro Abs: 2.9 10*3/uL (ref 1.7–7.7)
Neutrophils Relative %: 51 %
Platelets: 228 10*3/uL (ref 150–400)
RBC: 4.41 MIL/uL (ref 3.87–5.11)
RDW: 15.8 % — ABNORMAL HIGH (ref 11.5–15.5)
WBC: 5.6 10*3/uL (ref 4.0–10.5)
nRBC: 0 % (ref 0.0–0.2)

## 2018-06-17 LAB — CBG MONITORING, ED: Glucose-Capillary: 93 mg/dL (ref 70–99)

## 2018-06-17 NOTE — ED Provider Notes (Signed)
[facility]-EMERGENCY DEPT Provider Note   CSN: [id number] Arrival date & time: 06/17/18  0901    History   Chief Complaint Chief Complaint  Patient presents with  . Leg Pain    HPI Briana Wagner is a 28 y.o. female.   Pt presents today stating "I'm worried I have blood clots".   Pt states that 4 months ago, she fell onto her right lateral thigh, causing a very large bruise at that time.   She states "I never got it checked out".   Pt states that 4 days ago, she noticed that she had several painful bruises on both legs.  She denies any injury.  She denies fever, swelling, rashes to either leg.   She endorses shob, but states that she "feels like its the pollen acting up my asthma".   She denies dyspnea on exertion, dizziness, cp, abd pain, n/v/d.   [facility]   Denies recent surgery, long travel or oral BCP.   She is speaking in full sentences and does not appear to be in any distress.  She does not take any home medications.   The history is provided by the patient.    Past Medical History:  Diagnosis Date  . Asthma   . Bronchitis   . BV (bacterial vaginosis)   . Chlamydia     Patient Active Problem List   Diagnosis Date Noted  . NVD (normal vaginal delivery) 04/14/2014  . Preterm labor 04/13/2014  . [redacted] weeks gestation of pregnancy   . Vaginal bleeding in pregnancy 04/08/2014  . 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 Vaginal bleeding during pregnancy, antepartum 04/02/2014  . [redacted] weeks gestation of pregnancy 04/02/2014  . Subchorionic hemorrhage in first trimester 11/09/2013    Past Surgical History:  Procedure Laterality Date  . abortion     x2  . WISDOM TOOTH EXTRACTION  2014  . WISDOM TOOTH EXTRACTION       OB History    Gravida  4   Para  1   Term      Preterm  1   AB  2   Living  1     SAB      TAB  2   Ectopic      Multiple  0   Live Births  1            Home Medications    Prior to  Admission medications   Medication Sig Start Date End Date Taking? Authorizing Provider  fluconazole (DIFLUCAN) 200 MG tablet Take one dose by mouth, wait 72 hours, and then take second dose by mouth Patient not taking: Reported on 06/17/2018 11/12/17   Wurst, Grenada[place]ittany, PA-C  ibuprofen (ADVIL,MOTRIN) 800 MG tablet Take 1 tablet (800 mg total) by mouth 3 (three) times daily. Patient not taking: Reported on 06/17/2018 03/10/16   Joy, Ines BloomerShawn C, PA-C  metroNIDAZOLE (FLAGYL) 500 MG tablet Take 1 tablet (500 mg total) by mouth 2 (two) times daily. Patient not taking: Reported on 06/17/2018 11/12/17   Wurst, GrenadaBrittany, PA-C  metroNIDAZOLE (FLAGYL) 500 MG tablet Take 1 tablet (500 mg total) by mouth 2 (two) times daily. One po bid x 7 days Patient not taking: Reported on 06/17/2018 01/30/18   Street, [place], PA-C    Family History Family History  Problem Relation Age of Onset  . Hypertension Mother   . Aneurysm Mother   . Stroke Mother   . Heart disease Father  3 MIs, stints  . Hypertension Father   . COPD Father     Social History Social History   Tobacco Use  . Smoking status: Current Every Day Smoker    Packs/day: 0.25    Years: 9.00    Pack years: 2.25    Types: Cigarettes  . Smokeless tobacco: Never Used  Substance Use Topics  . Alcohol use: No  . Drug use: No     Allergies   Shellfish allergy   Review of Systems Review of Systems  Constitutional: Negative for chills, fever and unexpected weight change.  HENT: Negative for congestion, rhinorrhea, sinus pain and sore throat.   Respiratory: Positive for shortness of breath.   Cardiovascular: Negative for chest pain.  Gastrointestinal: Negative for diarrhea, nausea and vomiting.  Endocrine: Negative for polydipsia, polyphagia and polyuria.  Genitourinary: Negative for dysuria and frequency.  Musculoskeletal:       Pain/bruising to bilateral lower legs  Skin: Negative for rash.  Neurological: Negative for dizziness.   Hematological: Bruises/bleeds easily.     Physical Exam Updated Vital Signs BP 115/84 (BP Location: Left Arm) Comment: Simultaneous filing. User may not have seen previous data.  Pulse 71 Comment: Simultaneous filing. User may not have seen previous data.  Temp 98.5 F (36.9 C) (Oral)   Resp 16   Ht  (1.6 m)   Wt 65.8 kg   LMP 08/23/2017   SpO2 100% Comment: Simultaneous filing. User may not have seen previous data.  BMI 25.69 kg/m   Physical Exam Vitals signs and nursing note reviewed.  Constitutional:      General: She is not in acute distress.    Appearance: Normal appearance. She is not diaphoretic.  HENT:     Head: Normocephalic and atraumatic.     Nose: Nose normal.     Mouth/Throat:     Mouth: Mucous membranes are moist.  Eyes:     General:        Right eye: No discharge.        Left eye: No discharge.  Cardiovascular:     Rate and Rhythm: Normal rate and regular rhythm.  Pulmonary:     Effort: Pulmonary effort is normal. No respiratory distress.     Breath sounds: Normal breath sounds.  Abdominal:     General: There is no distension.     Tenderness: There is no abdominal tenderness. There is no rebound.  Musculoskeletal:     Comments: Noted 4 bruises to anterior RLE with mild ttp;  Noted 2 bruises to LLE;   There is no calf pain, swelling, erythema;  Distal pms intact;  Pulses equal/strong;  Skin otherwise p/w/d.  Skin:    General: Skin is warm.  Neurological:     Mental Status: She is alert and oriented to person, place, and time.     Coordination: Coordination normal.  Psychiatric:        Mood and Affect: Mood normal.        Behavior: Behavior normal.      ED Treatments / Results  Labs (all labs ordered are listed, but only abnormal results are displayed) Labs Reviewed  CBC WITH DIFFERENTIAL/PLATELET  CBG MONITORING, ED    EKG None  Radiology No results found.  Procedures Procedures (including critical care time)  Medications  Ordered in ED Medications - No data to display   Initial Impression / Assessment and Plan / ED Course  I have reviewed the triage vital signs and the nursing notes.  Pertinent labs & imaging results that were available during my care of the patient were reviewed by me and considered in my medical decision making (see chart for details).   Pt well appearing female with PMH of asthma presenting today stating multiple painful bruises to her legs.   No known injury.   She has no risk factors for blood clots.  She denies calf pain/swelling/erythema.  Denies claudication.   She does endorse some mild shob, but feels this is related to her asthma and pollen.   She is speaking in full sentences w/out distress.   She is not hypoxic or tachycardic.   Will check platelet counts;  If normal, will discharge pt to follow up with PCP outpatient.  Pt was also very nervous that she might be diabetic.   I ask why she has this concern and she states "I just am".    She denies any symptoms of polyuria/polydipsia or weight loss.   Will check CBG here, but she understands she will need an outpatient A1C for true evaluation of diabetes.  Final Clinical Impressions(s) / ED Diagnoses   Final diagnoses:  None    ED Discharge Orders    None       Anayelli Lai K, PA-C 06/17/18 1000    Azalia Bilis, MD 06/17/18 1235

## 2018-06-17 NOTE — ED Triage Notes (Signed)
Pt states that she is concerned for blood clots in both her legs. Pt states that she had a "big bruise" about 4 months ago

## 2018-06-17 NOTE — ED Notes (Signed)
Pt to ED reports increase in bruisng on legs x 4 months. "I think I have blood clots" d/t the bruisng. On assessment few scattered bruises noted on pt legs. Pt  Ambulatory, full ROM. Legs cool to touch, no redness, or swelling noted. Pt reports legs are tender to touch. Denies SOB.

## 2018-06-17 NOTE — ED Notes (Signed)
Pt d/c home per MD order. Discharge summary reviewed, pt verbalizes understanding. Voicing no complaints at d/c. Ambulatory off unit.

## 2018-06-17 NOTE — Discharge Instructions (Addendum)
Your lab work today was negative for any abnormal findings.   Please follow up with PCP in 1-2 weeks.

## 2018-06-17 NOTE — ED Notes (Signed)
ED provider at bedside.

## 2018-07-07 ENCOUNTER — Other Ambulatory Visit: Payer: Self-pay

## 2018-07-07 ENCOUNTER — Ambulatory Visit (HOSPITAL_COMMUNITY)
Admission: EM | Admit: 2018-07-07 | Discharge: 2018-07-07 | Disposition: A | Discharge status: Home / Self Care | Payer: Self-pay | Attending: Family Medicine | Admitting: Family Medicine

## 2018-07-07 ENCOUNTER — Encounter (HOSPITAL_COMMUNITY): Payer: Self-pay

## 2018-07-07 DIAGNOSIS — M545 Low back pain, unspecified: Secondary | ICD-10-CM

## 2018-07-07 DIAGNOSIS — N760 Acute vaginitis: Secondary | ICD-10-CM | POA: Insufficient documentation

## 2018-07-07 DIAGNOSIS — G8929 Other chronic pain: Secondary | ICD-10-CM | POA: Insufficient documentation

## 2018-07-07 LAB — POCT URINALYSIS DIP (DEVICE)
Bilirubin Urine: NEGATIVE
Glucose, UA: NEGATIVE mg/dL
Hgb urine dipstick: NEGATIVE
Ketones, ur: NEGATIVE mg/dL
Leukocytes,Ua: NEGATIVE
Nitrite: NEGATIVE
Protein, ur: NEGATIVE mg/dL
Specific Gravity, Urine: >=1.03 (ref 1.005–1.030)
Urobilinogen, UA: 0.2 mg/dL (ref 0.0–1.0)
pH: 6 (ref 5.0–8.0)

## 2018-07-07 MED ORDER — METRONIDAZOLE 500 MG PO TABS: 500.0000 mg | ORAL_TABLET | Freq: Two times a day (BID) | ORAL | 0 refills | Status: AC

## 2018-07-07 MED ORDER — NAPROXEN 375 MG PO TABS: 375.0000 mg | ORAL_TABLET | Freq: Two times a day (BID) | ORAL | 0 refills | Status: DC

## 2018-07-07 NOTE — ED Provider Notes (Signed)
[facility]    CSN: [id number] Arrival date & time: 07/07/18  3976     History   Chief Complaint Chief Complaint  Patient presents with  . Vaginal Discharge    HPI Briana Wagner is a 28 y.o. female.   Briana Wagner presents with complaints of white watery vaginal discharge which started approximately 2 weeks ago. Occasional pelvic cramping. Denies current pelvic or abdominal pain. Left sided low back pain, has been ongoing for 6 months now. Works on her feet on concrete floor. Doesn't take any medications for back pain. No radiation of pain, numbness or tingling of legs. No vaginal itching, sores or lesions. No vaginal bleeding. LMP 4/28. Normal bowel movements without straining, last was yesterday. No nausea or vomiting. No urinary symptoms. Occasional headache. Has had similar before and was BV. States she had chlamydia when she was 18. Has 1 partner, doesn't regularly use condoms.    ROS per HPI, negative if not otherwise mentioned.      Past Medical History:  Diagnosis Date  . Asthma   . Bronchitis   . BV (bacterial vaginosis)   . Chlamydia     Patient Active Problem List   Diagnosis Date Noted  . NVD (normal vaginal delivery) 04/14/2014  . Preterm labor 04/13/2014  . [redacted] weeks gestation of pregnancy   . Vaginal bleeding in pregnancy 04/08/2014  . 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 Vaginal bleeding during pregnancy, antepartum 04/02/2014  . [redacted] weeks gestation of pregnancy 04/02/2014  . Subchorionic hemorrhage in first trimester 11/09/2013    Past Surgical History:  Procedure Laterality Date  . abortion     x2  . WISDOM TOOTH EXTRACTION  2014  . WISDOM TOOTH EXTRACTION      OB History    Gravida  4   Para  1   Term      Preterm  1   AB  2   Living  1     SAB      TAB  2   Ectopic      Multiple  0   Live Births  1            Home Medications    Prior to Admission medications   Medication Sig Start Date End Date  Taking? Authorizing Provider  metroNIDAZOLE (FLAGYL) 500 MG tablet Take 1 tablet (500 mg total) by mouth 2 (two) times daily for 7 days. 07/07/18 07/14/18  Georgetta Haber, NP  naproxen (NAPROSYN) 375 MG tablet Take 1 tablet (375 mg total) by mouth 2 (two) times daily. 07/07/18   Georgetta Haber, NP    Family History Family History  Problem Relation Age of Onset  . Hypertension Mother   . Aneurysm Mother   . Stroke Mother   . Heart disease Father        3 MIs, stints  . Hypertension Father   . COPD Father     Social History Social History   Tobacco Use  . Smoking status: Current Every Day Smoker    Packs/day: 0.25    Years: 9.00    Pack years: 2.25    Types: Cigarettes  . Smokeless tobacco: Never Used  Substance Use Topics  . Alcohol use: No  . Drug use: No     Allergies   Shellfish allergy   Review of Systems Review of Systems   Physical Exam Triage Vital Signs ED Triage Vitals  Enc Vitals Group     BP  07/07/18 0910 118/90     Pulse Rate 07/07/18 0910 74     Resp 07/07/18 0910 16     Temp 07/07/18 0910 98.1 F (36.7 C)     Temp Source 07/07/18 0910 Oral     SpO2 07/07/18 0910 99 %     Weight 07/07/18 0913 145 lb (65.8 kg)     Height --      Head Circumference --      Peak Flow --      Pain Score 07/07/18 0913 0     Pain Loc --      Pain Edu? --      Excl. in GC? --    No data found.  Updated Vital Signs BP 118/90 (BP Location: Right Arm)   Pulse 74   Temp 98.1 F (36.7 C) (Oral)   Resp 16   Wt 145 lb (65.8 kg)   LMP 06/22/2018   SpO2 99%   BMI 25.69 kg/m    Physical Exam Constitutional:      General: She is not in acute distress.    Appearance: She is well-developed.  Cardiovascular:     Rate and Rhythm: Normal rate and regular rhythm.     Heart sounds: Normal heart sounds.  Pulmonary:     Effort: Pulmonary effort is normal.     Breath sounds: Normal breath sounds.  Abdominal:     Palpations: Abdomen is soft. Abdomen is not rigid.      Tenderness: There is no abdominal tenderness. There is no guarding or rebound.  Genitourinary:    Comments: Denies sores, lesions, vaginal bleeding; no pelvic pain; gu exam deferred at this time, vaginal self swab collected.   Musculoskeletal:     Lumbar back: She exhibits tenderness and pain. She exhibits no bony tenderness, no swelling, no edema, no deformity, no laceration, no spasm and normal pulse.       Back:     Comments: Ambulatory without difficulty; strength equal bilaterally; gross sensation intact to lower extremities; no step off or deformity to spinous processes   Skin:    General: Skin is warm and dry.  Neurological:     Mental Status: She is alert and oriented to person, place, and time.      UC Treatments / Results  Labs (all labs ordered are listed, but only abnormal results are displayed) Labs Reviewed  HIV ANTIBODY (ROUTINE TESTING W REFLEX)  RPR  POCT URINALYSIS DIP (DEVICE)  CERVICOVAGINAL ANCILLARY ONLY    EKG None  Radiology No results found.  Procedures Procedures (including critical care time)  Medications Ordered in UC Medications - No data to display  Initial Impression / Assessment and Plan / UC Course  I have reviewed the triage vital signs and the nursing notes.  Pertinent labs & imaging results that were available during my care of the patient were reviewed by me and considered in my medical decision making (see chart for details).     bv treatment initiated pending vaginal cytology results. Low back pain, chronic in nature. Discussed treatment options for this. Will notify of any positive findings and if any changes to treatment are needed.  If symptoms worsen or do not improve in the next week to return to be seen or to follow up with PCP. Marland Kitchen Patient verbalized understanding and agreeable to plan. Ambulatory out of clinic without difficulty.    Final Clinical Impressions(s) / UC Diagnoses   Final diagnoses:  Acute vaginitis   Chronic left-sided  low back pain without sciatica     Discharge Instructions     Use of naproxen twice a day to help with back pain, take with food. See back exercises provided.  Wear shoes with supportive heel while working on your feet.  Will start treatment for BV pending results of your vaginal swab.  Will notify you of any positive findings and if any changes to treatment are needed.  You may monitor your results on your MyChart online as well.   If symptoms worsen or do not improve in the next week to return to be seen or to follow up with your PCP.   Please withhold from intercourse for the next week. Please use condoms to prevent STD's.      ED Prescriptions    Medication Sig Dispense Auth. Provider   naproxen (NAPROSYN) 375 MG tablet Take 1 tablet (375 mg total) by mouth 2 (two) times daily. 20 tablet Linus MakoBurky, Natalie B, NP   metroNIDAZOLE (FLAGYL) 500 MG tablet Take 1 tablet (500 mg total) by mouth 2 (two) times daily for 7 days. 14 tablet Georgetta HaberBurky, Natalie B, NP     Controlled Substance Prescriptions [place] Controlled Substance Registry consulted? Not Applicable   Georgetta HaberBurky, Natalie B, NP 07/07/18 1024

## 2018-07-07 NOTE — ED Triage Notes (Signed)
Pt cc she has vaginal discharge x 2 weeks and she wants to be tested for STDs.

## 2018-07-07 NOTE — Discharge Instructions (Signed)
Use of naproxen twice a day to help with back pain, take with food. See back exercises provided.  Wear shoes with supportive heel while working on your feet.  Will start treatment for BV pending results of your vaginal swab.  Will notify you of any positive findings and if any changes to treatment are needed.  You may monitor your results on your MyChart online as well.   If symptoms worsen or do not improve in the next week to return to be seen or to follow up with your PCP.   Please withhold from intercourse for the next week. Please use condoms to prevent STD's.

## 2018-07-08 LAB — HIV ANTIBODY (ROUTINE TESTING W REFLEX): HIV Screen 4th Generation wRfx: NONREACTIVE

## 2018-07-08 LAB — RPR: RPR Ser Ql: NONREACTIVE

## 2018-07-09 LAB — CERVICOVAGINAL ANCILLARY ONLY
Bacterial vaginitis: POSITIVE — AB
Candida vaginitis: NEGATIVE
Chlamydia: NEGATIVE
Neisseria Gonorrhea: NEGATIVE
Trichomonas: NEGATIVE

## 2018-08-10 ENCOUNTER — Other Ambulatory Visit: Payer: Self-pay

## 2018-08-10 ENCOUNTER — Emergency Department (HOSPITAL_COMMUNITY)
Admission: EM | Admit: 2018-08-10 | Discharge: 2018-08-10 | Disposition: A | Discharge status: Home / Self Care | Payer: Self-pay | Attending: Emergency Medicine | Admitting: Emergency Medicine

## 2018-08-10 ENCOUNTER — Encounter (HOSPITAL_COMMUNITY): Payer: Self-pay | Admitting: Emergency Medicine

## 2018-08-10 ENCOUNTER — Emergency Department (HOSPITAL_COMMUNITY): Payer: Self-pay

## 2018-08-10 DIAGNOSIS — F17210 Nicotine dependence, cigarettes, uncomplicated: Secondary | ICD-10-CM | POA: Insufficient documentation

## 2018-08-10 DIAGNOSIS — G8929 Other chronic pain: Secondary | ICD-10-CM | POA: Insufficient documentation

## 2018-08-10 DIAGNOSIS — J45909 Unspecified asthma, uncomplicated: Secondary | ICD-10-CM | POA: Insufficient documentation

## 2018-08-10 DIAGNOSIS — M545 Low back pain: Secondary | ICD-10-CM | POA: Insufficient documentation

## 2018-08-10 DIAGNOSIS — R0789 Other chest pain: Secondary | ICD-10-CM | POA: Insufficient documentation

## 2018-08-10 DIAGNOSIS — M542 Cervicalgia: Secondary | ICD-10-CM | POA: Insufficient documentation

## 2018-08-10 MED ORDER — METHOCARBAMOL 500 MG PO TABS
500.0000 mg | ORAL_TABLET | Freq: Once | ORAL | Status: AC
  Administered 2018-08-10: 12:00:00 500 mg via ORAL
  Filled 2018-08-10: qty 1

## 2018-08-10 MED ORDER — NAPROXEN 500 MG PO TABS
500.0000 mg | ORAL_TABLET | Freq: Once | ORAL | Status: AC
  Administered 2018-08-10: 500 mg via ORAL
  Filled 2018-08-10: qty 1

## 2018-08-10 MED ORDER — METHOCARBAMOL 500 MG PO TABS: 500.0000 mg | ORAL_TABLET | Freq: Every evening | ORAL | 0 refills | Status: DC | PRN

## 2018-08-10 NOTE — Discharge Instructions (Signed)
Start Naproxen twice daily for pain Take Robaxin (muscle relaxer) as needed for muscle pain and spasms Try heat for stiff/sore muscles Try massage or consider going to a chiropractor Return if you are worsening

## 2018-08-10 NOTE — ED Provider Notes (Signed)
[facility]-EMERGENCY DEPT Provider Note   CSN: [id number] Arrival date & time: 08/10/18  1036     History   Chief Complaint Chief Complaint  Patient presents with  . Chest Pain  . Back Pain  . Neck Pain    HPI Briana Wagner is a 28 y.o. female who presents with neck pain, chest pain, low back pain.  No significant past medical history.  Patient states over the past couple of weeks she has had intermittent left-sided neck pain which radiates down her back and to the left upper chest wall.  She has had low back pain intermittently for several years.  Movement of her neck and lower back makes the pain worse.  Resting makes it better.  She has not tried anything for pain although has some naproxen.  She rates the pain at a 6 out of 10.  It feels like a stiff achiness at times and sometimes like a sharp shooting pain.  She denies any radiation of the pain down her arms or legs. She denies any heavy lifting or trauma. She denies fever, shortness of breath, cough, urinary symptoms.  She is wondering if her low back pain is due to damaged nerves from her epidural 4 years ago.  She had an x-ray in 2009 which showed mild scoliosis.     HPI  Past Medical History:  Diagnosis Date  . Asthma   . Bronchitis   . BV (bacterial vaginosis)   . Chlamydia     Patient Active Problem List   Diagnosis Date Noted  . NVD (normal vaginal delivery) 04/14/2014  . Preterm labor 04/13/2014  . [redacted] weeks gestation of pregnancy   . Vaginal bleeding in pregnancy 04/08/2014  . 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 Vaginal bleeding during pregnancy, antepartum 04/02/2014  . [redacted] weeks gestation of pregnancy 04/02/2014  . Subchorionic hemorrhage in first trimester 11/09/2013    Past Surgical History:  Procedure Laterality Date  . abortion     x2  . WISDOM TOOTH EXTRACTION  2014  . WISDOM TOOTH EXTRACTION       OB History    Gravida  4   Para  1   Term      Preterm  1   AB  2   Living  1     SAB      TAB  2   Ectopic      Multiple  0   Live Births  1            Home Medications    Prior to Admission medications   Medication Sig Start Date End Date Taking? Authorizing Provider  naproxen (NAPROSYN) 375 MG tablet Take 1 tablet (375 mg total) by mouth 2 (two) times daily. 07/07/18   Georgetta HaberBurky, Natalie B, NP    Family History Family History  Problem Relation Age of Onset  . Hypertension Mother   . Aneurysm Mother   . Stroke Mother   . Heart disease Father        3 MIs, stints  . Hypertension Father   . COPD Father     Social History Social History   Tobacco Use  . Smoking status: Current Every Day Smoker    Packs/day: 0.25    Years: 9.00    Pack years: 2.25    Types: Cigarettes  . Smokeless tobacco: Never Used  Substance Use Topics  . Alcohol use: No  . Drug use: No     Allergies  Shellfish allergy   Review of Systems Review of Systems  Constitutional: Negative for fever.  Respiratory: Negative for shortness of breath.   Cardiovascular: Positive for chest pain.  Gastrointestinal: Negative for abdominal pain.  Genitourinary: Negative for dysuria.  Musculoskeletal: Positive for back pain, myalgias and neck pain.     Physical Exam Updated Vital Signs BP 130/88 (BP Location: Right Arm)   Pulse 64   Temp 98.4 F (36.9 C) (Oral)   Resp 16   Ht 5\' 3"  (1.6 m)   Wt 64.9 kg   LMP 08/16/2017   SpO2 100%   BMI 25.33 kg/m   Physical Exam Vitals signs and nursing note reviewed.  Constitutional:      General: She is not in acute distress.    Appearance: Normal appearance. She is well-developed. She is not ill-appearing.  HENT:     Head: Normocephalic and atraumatic.  Eyes:     General: No scleral icterus.       Right eye: No discharge.        Left eye: No discharge.     Conjunctiva/sclera: Conjunctivae normal.     Pupils: Pupils are equal, round, and reactive to light.  Neck:     Musculoskeletal: Normal range of  motion. Muscular tenderness (left paraspinal muscle tenderness and left trapezius) present.     Comments: No midline tenderness Cardiovascular:     Rate and Rhythm: Normal rate and regular rhythm.  Pulmonary:     Effort: Pulmonary effort is normal. No respiratory distress.     Breath sounds: Normal breath sounds.  Abdominal:     General: There is no distension.     Palpations: Abdomen is soft.     Tenderness: There is no abdominal tenderness.  Musculoskeletal:     Comments: Tenderness over left lumbar paraspinal muscles. No midline tenderness  Skin:    General: Skin is warm and dry.  Neurological:     Mental Status: She is alert and oriented to person, place, and time.     Comments: 5/5 upper and lower extremity strength  Psychiatric:        Behavior: Behavior normal.      ED Treatments / Results  Labs (all labs ordered are listed, but only abnormal results are displayed) Labs Reviewed - No data to display  EKG EKG Interpretation  Date/Time:  Tuesday August 10 2018 10:43:44 EDT Ventricular Rate:  61 PR Interval:    QRS Duration: 90 QT Interval:  409 QTC Calculation: 412 R Axis:   68 Text Interpretation:  Sinus rhythm Low voltage, precordial leads RSR' in V1 or V2, probably normal variant Confirmed by Virgina NorfolkAdam, Curatolo [phone]([id number]) on 08/10/2018 10:53:41 AM   Radiology Dg Chest 2 View  Result Date: 08/10/2018 CLINICAL DATA:  Left chest and back pain for 2 weeks. EXAM: CHEST - 2 VIEW COMPARISON:  None. FINDINGS: The heart size and mediastinal contours are within normal limits. Both lungs are clear. The visualized skeletal structures are unremarkable. IMPRESSION: Normal.  No active cardiopulmonary disease. Electronically Signed   By: Myles RosenthalJohn  Stahl M.D.   On: 08/10/2018 11:37    Procedures Procedures (including critical care time)  Medications Ordered in ED Medications  naproxen (NAPROSYN) tablet 500 mg (has no administration in time range)  methocarbamol (ROBAXIN) tablet 500 mg  (has no administration in time range)     Initial Impression / Assessment and Plan / ED Course  I have reviewed the triage vital signs and the nursing notes.  Pertinent labs &  imaging results that were available during my care of the patient were reviewed by me and considered in my medical decision making (see chart for details).  28 year old female with intermittent neck pain, chest pain, low back pain for several weeks.  Her vital signs are normal.  She is well-appearing.  Pain is reproducible with movement of her neck and palpation.  Pain is likely musculoskeletal.  Doubt PE, pericarditis, ACS.  EKG is sinus rhythm.  Chest x-ray is negative.  Do not think she needs further work-up with blood work today.  She was given pain medicine and muscle relaxer.  We discussed a treatment plan with conservative measures including heat, meds, massage etc.  She was given return precautions  Final Clinical Impressions(s) / ED Diagnoses   Final diagnoses:  Neck pain  Atypical chest pain  Chronic left-sided low back pain without sciatica    ED Discharge Orders    None       Recardo Evangelist, PA-C 08/10/18 [place], [place], DO 08/10/18 1513

## 2018-08-10 NOTE — ED Notes (Signed)
Bed: WA07 Expected date:  Expected time:  Means of arrival:  Comments: 

## 2018-08-10 NOTE — ED Notes (Signed)
Bed: WA08 Expected date:  Expected time:  Means of arrival:  Comments: 

## 2018-08-10 NOTE — ED Triage Notes (Signed)
Pt reports had intermittent left chest, back and neck pains for 2 weeks.

## 2018-08-12 ENCOUNTER — Inpatient Hospital Stay (HOSPITAL_COMMUNITY)
Admission: AD | Admit: 2018-08-12 | Discharge: 2018-08-12 | Disposition: A | Discharge status: Home / Self Care | Payer: Self-pay | Attending: Obstetrics and Gynecology | Admitting: Obstetrics and Gynecology

## 2018-08-12 ENCOUNTER — Other Ambulatory Visit: Payer: Self-pay

## 2018-08-12 DIAGNOSIS — Z3202 Encounter for pregnancy test, result negative: Secondary | ICD-10-CM | POA: Insufficient documentation

## 2018-08-12 LAB — POCT PREGNANCY, URINE: Preg Test, Ur: NEGATIVE

## 2018-08-12 NOTE — Progress Notes (Signed)
Pt seen by N. Nugent, NP, given list for other options of care.

## 2018-08-12 NOTE — MAU Note (Signed)
Presents with c/o abdominal pain.  Reports had negative pregnancy test @ home, desires pregnacy testing today.

## 2018-08-12 NOTE — MAU Provider Note (Signed)
S Briana Wagner is a 28 y.o. [phone] non-pregnant female who presents to MAU today with complaint of back pain.   O BP 119/80 (BP Location: Right Arm)   Pulse 70   Temp 97.7 F (36.5 C) (Oral)   Resp 20   Ht 5\' 3"  (1.6 m)   Wt 69.5 kg   LMP 07/19/2018   SpO2 98%   BMI 27.12 kg/m  Physical Exam  Constitutional: She is oriented to person, place, and time. She appears well-developed and well-nourished. No distress.  HENT:  Head: Normocephalic and atraumatic.  Respiratory: Effort normal.  Neurological: She is alert and oriented to person, place, and time.  Skin: She is not diaphoretic.  Psychiatric: She has a normal mood and affect. Her behavior is normal.   A Non pregnant female Medical screening exam complete  P Discharge from MAU in stable condition Patient given the option of transfer to [facility] for further evaluation or seek care in outpatient facility of choice List of options for follow-up given  Warning signs for worsening condition that would warrant emergency follow-up discussed Patient may return to MAU as needed for pregnancy related complaints  Mikayah Joy, Gerrie Nordmann, NP 08/12/2018 9:52 AM

## 2019-02-25 NOTE — L&D Delivery Note (Signed)
OB/GYN Faculty Practice Delivery Note  Briana Wagner is a 29 y.o. [id number] s/p FB, cytotec, and pitocin titration at [redacted]w[redacted]d. She was admitted for induction of labor due to decreased fetal movement and EFW in 11%.   ROM: 5h 78m with clear fluid GBS Status: Positive Maximum Maternal Temperature: 98.5  Labor Progress: . Labor progressed well s/p foley bulb, cytotec, and pitocin titration. Patient dilated completely and pushed well for 20 minutes.    Delivery Date/Time: 01/18/2020 at 0850 Delivery: Called to room and patient was complete and pushing. Head delivered ROA. No nuchal cord present. Shoulder and body delivered in usual fashion. Infant with spontaneous cry, placed on mother's abdomen, dried and stimulated. Cord clamped x 2 after 1-minute delay, and cut by FOB. Cord blood drawn. Placenta delivered spontaneously, intact, with 3-vessel cord. Fundus firm with massage and Pitocin. Labia, perineum, vagina, and cervix inspected, no laceration found.  Placenta: 01/18/2020 at 0853, spontaneous, intact Complications: none Lacerations: none - intact  EBL: 150 Analgesia: Epidural  Postpartum Planning [x]  transfer orders to MB [x]  discharge summary started & shared [x]  message to sent to schedule follow-up  [x]  lists updated [x]  vaccines UTD  Infant: Female  APGARs 9/9  3050 g  , [facility] 01/18/2020, 11:33 AM

## 2019-05-28 ENCOUNTER — Inpatient Hospital Stay (HOSPITAL_COMMUNITY)
Admission: AD | Admit: 2019-05-28 | Discharge: 2019-05-29 | Disposition: A | Discharge status: Home / Self Care | Payer: Medicaid Other | Attending: Obstetrics & Gynecology | Admitting: Obstetrics & Gynecology

## 2019-05-28 ENCOUNTER — Inpatient Hospital Stay (HOSPITAL_COMMUNITY): Payer: Medicaid Other

## 2019-05-28 ENCOUNTER — Other Ambulatory Visit: Payer: Self-pay

## 2019-05-28 ENCOUNTER — Encounter (HOSPITAL_COMMUNITY): Payer: Self-pay

## 2019-05-28 ENCOUNTER — Emergency Department (HOSPITAL_COMMUNITY): Payer: Medicaid Other

## 2019-05-28 DIAGNOSIS — O99331 Smoking (tobacco) complicating pregnancy, first trimester: Secondary | ICD-10-CM | POA: Insufficient documentation

## 2019-05-28 DIAGNOSIS — Z3A01 Less than 8 weeks gestation of pregnancy: Secondary | ICD-10-CM | POA: Insufficient documentation

## 2019-05-28 DIAGNOSIS — O208 Other hemorrhage in early pregnancy: Secondary | ICD-10-CM | POA: Insufficient documentation

## 2019-05-28 DIAGNOSIS — F17210 Nicotine dependence, cigarettes, uncomplicated: Secondary | ICD-10-CM | POA: Insufficient documentation

## 2019-05-28 DIAGNOSIS — O26899 Other specified pregnancy related conditions, unspecified trimester: Secondary | ICD-10-CM

## 2019-05-28 DIAGNOSIS — O468X1 Other antepartum hemorrhage, first trimester: Secondary | ICD-10-CM

## 2019-05-28 DIAGNOSIS — O9A211 Injury, poisoning and certain other consequences of external causes complicating pregnancy, first trimester: Secondary | ICD-10-CM | POA: Insufficient documentation

## 2019-05-28 DIAGNOSIS — W2201XA Walked into wall, initial encounter: Secondary | ICD-10-CM | POA: Insufficient documentation

## 2019-05-28 DIAGNOSIS — O418X10 Other specified disorders of amniotic fluid and membranes, first trimester, not applicable or unspecified: Secondary | ICD-10-CM

## 2019-05-28 DIAGNOSIS — W228XXA Striking against or struck by other objects, initial encounter: Secondary | ICD-10-CM | POA: Insufficient documentation

## 2019-05-28 DIAGNOSIS — S63613A Unspecified sprain of left middle finger, initial encounter: Secondary | ICD-10-CM | POA: Insufficient documentation

## 2019-05-28 DIAGNOSIS — R1031 Right lower quadrant pain: Secondary | ICD-10-CM | POA: Diagnosis present

## 2019-05-28 DIAGNOSIS — O209 Hemorrhage in early pregnancy, unspecified: Secondary | ICD-10-CM

## 2019-05-28 DIAGNOSIS — R109 Unspecified abdominal pain: Secondary | ICD-10-CM

## 2019-05-28 DIAGNOSIS — R102 Pelvic and perineal pain: Secondary | ICD-10-CM

## 2019-05-28 DIAGNOSIS — Z3490 Encounter for supervision of normal pregnancy, unspecified, unspecified trimester: Secondary | ICD-10-CM

## 2019-05-28 DIAGNOSIS — S61309A Unspecified open wound of unspecified finger with damage to nail, initial encounter: Secondary | ICD-10-CM

## 2019-05-28 DIAGNOSIS — N898 Other specified noninflammatory disorders of vagina: Secondary | ICD-10-CM

## 2019-05-28 LAB — CBC
HCT: 43.7 % (ref 36.0–46.0)
Hemoglobin: 15.2 g/dL — ABNORMAL HIGH (ref 12.0–15.0)
MCH: 31.8 pg (ref 26.0–34.0)
MCHC: 34.8 g/dL (ref 30.0–36.0)
MCV: 91.4 fL (ref 80.0–100.0)
Platelets: 218 10*3/uL (ref 150–400)
RBC: 4.78 MIL/uL (ref 3.87–5.11)
RDW: 13.1 % (ref 11.5–15.5)
WBC: 8.4 10*3/uL (ref 4.0–10.5)
nRBC: 0 % (ref 0.0–0.2)

## 2019-05-28 LAB — COMPREHENSIVE METABOLIC PANEL
ALT: 13 U/L (ref 0–44)
AST: 22 U/L (ref 15–41)
Albumin: 4.3 g/dL (ref 3.5–5.0)
Alkaline Phosphatase: 60 U/L (ref 38–126)
Anion gap: 15 (ref 5–15)
BUN: 10 mg/dL (ref 6–20)
CO2: 21 mmol/L — ABNORMAL LOW (ref 22–32)
Calcium: 9.5 mg/dL (ref 8.9–10.3)
Chloride: 100 mmol/L (ref 98–111)
Creatinine, Ser: 0.84 mg/dL (ref 0.44–1.00)
GFR calc Af Amer: >60 mL/min (ref >60)
GFR calc non Af Amer: >60 mL/min (ref >60)
Glucose, Bld: 93 mg/dL (ref 70–99)
Potassium: 3.9 mmol/L (ref 3.5–5.1)
Sodium: 136 mmol/L (ref 135–145)
Total Bilirubin: 0.5 mg/dL (ref 0.3–1.2)
Total Protein: 7.7 g/dL (ref 6.5–8.1)

## 2019-05-28 LAB — URINALYSIS, ROUTINE W REFLEX MICROSCOPIC
Bilirubin Urine: NEGATIVE
Glucose, UA: NEGATIVE mg/dL
Hgb urine dipstick: NEGATIVE
Ketones, ur: NEGATIVE mg/dL
Leukocytes,Ua: NEGATIVE
Nitrite: NEGATIVE
Protein, ur: 100 mg/dL — AB
Specific Gravity, Urine: 1.025 (ref 1.005–1.030)
pH: 6 (ref 5.0–8.0)

## 2019-05-28 LAB — PREGNANCY, URINE: Preg Test, Ur: POSITIVE — AB

## 2019-05-28 LAB — WET PREP, GENITAL
Sperm: NONE SEEN
Trich, Wet Prep: NONE SEEN

## 2019-05-28 MED ORDER — LIDOCAINE HCL (PF) 1 % IJ SOLN
10.0000 mL | Freq: Once | INTRAMUSCULAR | Status: AC
  Administered 2019-05-28: 10 mL
  Filled 2019-05-28: qty 10

## 2019-05-28 MED ORDER — ACETAMINOPHEN 325 MG PO TABS
650.0000 mg | ORAL_TABLET | Freq: Once | ORAL | Status: AC
  Administered 2019-05-28: 650 mg via ORAL
  Filled 2019-05-28: qty 2

## 2019-05-28 NOTE — MAU Provider Note (Signed)
History     CSN: [id number]  Arrival date and time: 05/28/19 1836   First Provider Initiated Contact with Patient 05/28/19 2305      Chief Complaint  Patient presents with  . Hand Injury  . Possible Pregnancy   HPI Briana Wagner is a 29 y.o. [phone] who presents to MAU from [facility] with chief complaint of abdominal pain and abnormal vaginal discharge. These are new problems, onset about two days ago. Patient endorses recurrent RLQ pain, pain score 5/10, non-radiating. Her vaginal discharge is white, heavy and foul smelling. She denies dysuria, abdominal tenderness, fever or recent illness. Most recent intercourse within the past week.  OB History    Gravida  4   Para  1   Term      Preterm  1   AB  2   Living  1     SAB      TAB  2   Ectopic      Multiple  0   Live Births  1           Past Medical History:  Diagnosis Date  . Asthma   . Bronchitis   . BV (bacterial vaginosis)   . Chlamydia     Past Surgical History:  Procedure Laterality Date  . abortion     x2  . WISDOM TOOTH EXTRACTION  2014  . WISDOM TOOTH EXTRACTION      Family History  Problem Relation Age of Onset  . Hypertension Mother   . Aneurysm Mother   . Stroke Mother   . Heart disease Father        3 MIs, stints  . Hypertension Father   . COPD Father     Social History   Tobacco Use  . Smoking status: Current Every Day Smoker    Packs/day: 0.25    Years: 9.00    Pack years: 2.25    Types: Cigarettes  . Smokeless tobacco: Never Used  Substance Use Topics  . Alcohol use: No  . Drug use: No    Allergies:  Allergies  Allergen Reactions  . Shellfish Allergy Hives and Swelling    Medications Prior to Admission  Medication Sig Dispense Refill Last Dose  . Multiple Vitamin (MULTIVITAMIN ADULT PO) Take 1 tablet by mouth daily.   Past Week at Unknown time  . methocarbamol (ROBAXIN) 500 MG tablet Take 1 tablet (500 mg total) by mouth at bedtime and may repeat dose one  time if needed. (Patient not taking: Reported on 05/28/2019) 10 tablet 0 Not Taking at Unknown time  . naproxen (NAPROSYN) 375 MG tablet Take 1 tablet (375 mg total) by mouth 2 (two) times daily. (Patient not taking: Reported on 05/28/2019) 20 tablet 0 Not Taking at Unknown time    Review of Systems  Gastrointestinal: Positive for abdominal pain.  Genitourinary: Positive for vaginal discharge.  Musculoskeletal: Negative for back pain.  All other systems reviewed and are negative.  Physical Exam   Blood pressure 126/79, pulse 89, temperature 98.2 F (36.8 C), temperature source Oral, resp. rate 18, last menstrual period 04/15/2019, SpO2 99 %, unknown if currently breastfeeding.  Physical Exam  Nursing note and vitals reviewed. Constitutional: She appears well-developed and well-nourished.  Cardiovascular: Normal rate and normal heart sounds.  Respiratory: Effort normal and breath sounds normal. She has no decreased breath sounds.  GI: Soft. Bowel sounds are normal. She exhibits no distension. There is no abdominal tenderness. There is no rebound, no guarding and  no CVA tenderness.  Genitourinary:    Genitourinary Comments: Pelvic exam: External genitalia normal, vaginal walls pink and well rugated, cervix visually closed, no lesions noted. Thick white vaginal discharge visible at introitus and throughout vaginal vault.     Skin: Skin is warm and dry.  Psychiatric: She has a normal mood and affect. Her behavior is normal. Judgment and thought content normal.    MAU Course/MDM  Procedures  Patient Vitals for the past 24 hrs:  BP Temp Temp src Pulse Resp SpO2  05/29/19 0044 132/83 -- -- 73 -- --  05/28/19 2306 133/81 98.3 F (36.8 C) -- 69 17 --  05/28/19 1844 126/79 98.2 F (36.8 C) Oral 89 18 99 %   Results for orders placed or performed during the hospital encounter of 05/28/19 (from the past 24 hour(s))  Urinalysis, Routine w reflex microscopic     Status: Abnormal   Collection  Time: 05/28/19  7:00 PM  Result Value Ref Range   Color, Urine YELLOW YELLOW   APPearance CLOUDY (A) CLEAR   Specific Gravity, Urine 1.025 1.005 - 1.030   pH 6.0 5.0 - 8.0   Glucose, UA NEGATIVE NEGATIVE mg/dL   Hgb urine dipstick NEGATIVE NEGATIVE   Bilirubin Urine NEGATIVE NEGATIVE   Ketones, ur NEGATIVE NEGATIVE mg/dL   Protein, ur 627 (A) NEGATIVE mg/dL   Nitrite NEGATIVE NEGATIVE   Leukocytes,Ua NEGATIVE NEGATIVE   RBC / HPF 0-5 0 - 5 RBC/hpf   WBC, UA 0-5 0 - 5 WBC/hpf   Bacteria, UA RARE (A) NONE SEEN   Squamous Epithelial / LPF 21-50 0 - 5   Mucus PRESENT   Pregnancy, urine     Status: Abnormal   Collection Time: 05/28/19  7:00 PM  Result Value Ref Range   Preg Test, Ur POSITIVE (A) NEGATIVE  hCG, quantitative, pregnancy     Status: Abnormal   Collection Time: 05/28/19 10:41 PM  Result Value Ref Range   hCG, Beta Chain, Quant, S 11,086 (H) <5 mIU/mL  CBC     Status: Abnormal   Collection Time: 05/28/19 10:41 PM  Result Value Ref Range   WBC 8.4 4.0 - 10.5 K/uL   RBC 4.78 3.87 - 5.11 MIL/uL   Hemoglobin 15.2 (H) 12.0 - 15.0 g/dL   HCT 03.5 00.9 - 38.1 %   MCV 91.4 80.0 - 100.0 fL   MCH 31.8 26.0 - 34.0 pg   MCHC 34.8 30.0 - 36.0 g/dL   RDW 82.9 93.7 - 16.9 %   Platelets 218 150 - 400 K/uL   nRBC 0.0 0.0 - 0.2 %  Comprehensive metabolic panel     Status: Abnormal   Collection Time: 05/28/19 10:41 PM  Result Value Ref Range   Sodium 136 135 - 145 mmol/L   Potassium 3.9 3.5 - 5.1 mmol/L   Chloride 100 98 - 111 mmol/L   CO2 21 (L) 22 - 32 mmol/L   Glucose, Bld 93 70 - 99 mg/dL   BUN 10 6 - 20 mg/dL   Creatinine, Ser 6.78 0.44 - 1.00 mg/dL   Calcium 9.5 8.9 - 93.8 mg/dL   Total Protein 7.7 6.5 - 8.1 g/dL   Albumin 4.3 3.5 - 5.0 g/dL   AST 22 15 - 41 U/L   ALT 13 0 - 44 U/L   Alkaline Phosphatase 60 38 - 126 U/L   Total Bilirubin 0.5 0.3 - 1.2 mg/dL   GFR calc non Af Amer >60 >60 mL/min   GFR  calc Af Amer >60 >60 mL/min   Anion gap 15 5 - 15  Wet prep,  genital     Status: Abnormal   Collection Time: 05/28/19 11:10 PM  Result Value Ref Range   Yeast Wet Prep HPF POC PRESENT (A) NONE SEEN   Trich, Wet Prep NONE SEEN NONE SEEN   Clue Cells Wet Prep HPF POC PRESENT (A) NONE SEEN   WBC, Wet Prep HPF POC MODERATE (A) NONE SEEN   Sperm NONE SEEN    Meds ordered this encounter  Medications  . lidocaine (PF) (XYLOCAINE) 1 % injection 10 mL  . acetaminophen (TYLENOL) tablet 650 mg  . Prenatal Vit-Iron Carbonyl-FA (PRENATAL PLUS IRON) 29-1 MG TABS    Sig: Take 1 tablet by mouth every morning.    Dispense:  30 tablet    Refill:  3    Order Specific Question:   Supervising Provider    Answer:   Elonda Husky, LUTHER H [[id number]]  . terconazole (TERAZOL 3) 0.8 % vaginal cream    Sig: Place 1 applicator vaginally at bedtime. Apply nightly for three nights.    Dispense:  20 g    Refill:  0    Order Specific Question:   Supervising Provider    Answer:   Elonda Husky, LUTHER H [[id number]]   US OB LESS THAN 14 WEEKS WITH OB TRANSVAGINAL  Result Date: 05/29/2019 CLINICAL DATA:  Vaginal bleeding and pelvic pain, initial encounter EXAM: OBSTETRIC <14 WK [place] AND TRANSVAGINAL OB US TECHNIQUE: Both transabdominal and transvaginal ultrasound examinations were performed for complete evaluation of the gestation as well as the maternal uterus, adnexal regions, and pelvic cul-de-sac. Transvaginal technique was performed to assess early pregnancy. COMPARISON:  None. FINDINGS: Intrauterine gestational sac: Single Yolk sac:  Present Embryo:  Present Cardiac Activity: Present Heart Rate: 97 bpm CRL:  3 mm   5 w   5 d                  [place] EDC: 01/23/2020 Subchorionic hemorrhage:  Small subchorionic hemorrhage is noted. Maternal uterus/adnexae: Ovaries are within normal limits bilaterally. No free fluid is noted. IMPRESSION: Single live intrauterine gestation at 5 weeks 5 days with small subchorionic hemorrhage. No other focal abnormality is noted. Electronically Signed   By: Inez Catalina M.D.   On:  05/29/2019 00:29   Assessment and Plan  --29 y.o. [id number] with [facility] at [facility] hematoma --Advised pelvis rest, nothing in vagina uncleared by Provider --Blood type O POS --Discharge home in stable condition  Darlina Rumpf, CNM 05/29/2019, 3:23 AM

## 2019-05-28 NOTE — ED Provider Notes (Addendum)
[facility] EMERGENCY DEPARTMENT Provider Note   CSN: [id number] Arrival date & time: 05/28/19  1836     History Chief Complaint  Patient presents with   Hand Injury   Possible Pregnancy    Briana Wagner is a 29 y.o. female.  Patient presents to the emergency department with multiple complaints.  Patient injured left hand yesterday when she struck her hand on a wall.  She has acrylic nails.  She complains of left middle finger pain and swelling. In addition on her left little finger, the natural nail partially dislodged radially.  Pain is made worse with palpation.  She denies other injuries to the hand, wrist, elbow or shoulder.  Patient also found that recently that she is pregnant.  She has been having some mild right-sided groin pain.  Her last menstrual period was approximately 04/15/2019.  She has not had any vaginal bleeding.  She reports a brown discharge.  She denies any fever, nausea, vomiting, diarrhea.  No chest pain or shortness of breath.        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   Vaginal bleeding during pregnancy, antepartum 04/02/2014   [redacted] weeks gestation of pregnancy 04/02/2014   Subchorionic hemorrhage in first trimester 11/09/2013    Past Surgical History:  Procedure Laterality Date   abortion     x2   WISDOM TOOTH EXTRACTION  2014   WISDOM TOOTH EXTRACTION       OB History    Gravida  5   Para  1   Term      Preterm  1   AB  2   Living  1     SAB      TAB  2   Ectopic      Multiple  0   Live Births  1           Family History  Problem Relation Age of Onset   Hypertension Mother      Aneurysm Mother    Stroke Mother    Heart disease Father        3 MIs, stints   Hypertension Father    COPD Father     Social History   Tobacco Use   Smoking status: Current Every Day Smoker    Packs/day: 0.25    Years: 9.00    Pack years: 2.25    Types: Cigarettes   Smokeless tobacco: Never Used  Substance Use Topics   Alcohol use: No   Drug use: No    Home Medications Prior to Admission medications   Medication Sig Start Date End Date Taking? Authorizing Provider  methocarbamol (ROBAXIN) 500 MG tablet Take 1 tablet (500 mg total) by mouth at bedtime and may repeat dose one time if needed. 08/10/18   Recardo Evangelist, PA-C  naproxen (NAPROSYN) 375 MG tablet Take 1 tablet (375 mg total) by mouth 2 (two) times daily. 07/07/18   Zigmund Gottron, NP    Allergies    Shellfish allergy  Review of Systems   Review of Systems  Constitutional: Negative for activity change and fever.  Gastrointestinal: Positive  for abdominal pain. Negative for diarrhea, nausea and vomiting.  Genitourinary: Positive for pelvic pain and vaginal discharge. Negative for vaginal bleeding.  Musculoskeletal: Positive for arthralgias. Negative for back pain, joint swelling and neck pain.  Skin: Negative for wound.  Neurological: Negative for weakness and numbness.    Physical Exam Updated Vital Signs BP 126/79 (BP Location: Right Arm)    Pulse 89    Temp 98.2 F (36.8 C) (Oral)    Resp 18    SpO2 99%   Physical Exam Vitals and nursing note reviewed.  Constitutional:      Appearance: She is well-developed.  HENT:     Head: Normocephalic and atraumatic.  Eyes:     General:        Right eye: No discharge.        Left eye: No discharge.     Conjunctiva/sclera: Conjunctivae normal.  Cardiovascular:     Rate and Rhythm: Normal rate.  Pulmonary:     Effort: Pulmonary effort is normal.     Breath sounds: Normal breath sounds.  Abdominal:     Palpations: Abdomen is soft.      Tenderness: There is no abdominal tenderness. There is no guarding or rebound.  Musculoskeletal:     Cervical back: Normal range of motion and neck supple.     Comments: Left hand: Patient has full range of motion in flexion extension at all joints in her left hand and wrist.  Left middle finger: There is some mild swelling between the DIP and PIP joints.  No ecchymosis.  No deformities.  Left little finger: Patient's nail is partially removed from the nail fold radially.  After digital block, the nail plate was found to be barely adhered to the ulnar lateral nail margin. The nailbed appears intact.  No signs of laceration.   Skin:    General: Skin is warm and dry.  Neurological:     Mental Status: She is alert.     ED Results / Procedures / Treatments   Labs (all labs ordered are listed, but only abnormal results are displayed) Labs Reviewed  URINALYSIS, ROUTINE W REFLEX MICROSCOPIC - Abnormal; Notable for the following components:      Result Value   APPearance CLOUDY (*)    Protein, ur 100 (*)    Bacteria, UA RARE (*)    All other components within normal limits  PREGNANCY, URINE - Abnormal; Notable for the following components:   Preg Test, Ur POSITIVE (*)    All other components within normal limits    EKG None  Radiology DG Hand Complete Left  Result Date: 05/28/2019 CLINICAL DATA:  Acute pain due to trauma. EXAM: LEFT HAND - COMPLETE 3+ VIEW COMPARISON:  None. FINDINGS: There is no evidence of fracture or dislocation. There is no evidence of arthropathy or other focal bone abnormality. Soft tissues are unremarkable. IMPRESSION: Negative. Electronically Signed   By: Katherine Mantle M.D.   On: 05/28/2019 19:33    Procedures Procedures (including critical care time)  Medications Ordered in ED Medications  lidocaine (PF) (XYLOCAINE) 1 % injection 10 mL (10 mLs Infiltration Given 05/28/19 2121)    ED Course  I have reviewed the triage vital signs and the nursing  notes.  Pertinent labs & imaging results that were available during my care of the patient were reviewed by me and considered in my medical decision making (see chart for details).  Patient seen and examined.  Discussed x-ray results with patient.  We  discussed trying to receive the nail after digital block of the finger versus observation wound care.  Patient would like me to proceed with digital block to attempt to place the nail back.  We discussed that this nail will likely fall off in the near future.  She asked if I would be removing the nail, I discussed that I would like to try to minimize trauma to the nail bed and we discussed importance of having the nail hold open the nail fold to help facilitate new nail formation.  Vital signs reviewed and are as follows: BP 126/79 (BP Location: Right Arm)    Pulse 89    Temp 98.2 F (36.8 C) (Oral)    Resp 18    SpO2 99%   Nail dermabonded into place with no difficulty. Splinted by RN.   Discussed wound care, need to monitor for infection, and protect the nail that is currently there.  I discussed abdominal pain, discharge complaint with MAU provider.  Patient will be transferred to the MAU for further evaluation of that complaint.     MDM Rules/Calculators/A&P                      Patient with nail avulsion, receded and repaired in the emergency department.  X-rays negative.  Suspect sprain of middle finger.    Final Clinical Impression(s) / ED Diagnoses Final diagnoses:  Traumatic avulsion of nail plate of finger, initial encounter  Sprain of left middle finger, unspecified site of digit, initial encounter  Pregnancy, unspecified gestational age  Pelvic pain in pregnancy  Vaginal discharge during pregnancy in first trimester    Rx / DC Orders ED Discharge Orders    None       Renne Crigler, PA-C 05/28/19 2155    Renne Crigler, PA-C 05/28/19 2252    Raeford Razor, MD 05/29/19 2118

## 2019-05-28 NOTE — ED Notes (Signed)
MAU tech transporting pt to MAU

## 2019-05-28 NOTE — ED Triage Notes (Signed)
Pt from home, injured L hand yesterday causing swelling to middle finger and artificial nail to dig into cuticle. Pt also states yesterday she took a pregnancy test and it was positive.

## 2019-05-28 NOTE — MAU Note (Signed)
Patient states she found out she was pregnant yesterday.  LMP 04/15/19.  Started having lower abdominal pain a few days ago, mostly in the RLQ.  Some brown/red discharge over the past few days.

## 2019-05-28 NOTE — Discharge Instructions (Addendum)
Regarding your fingernail injury: Please protect the finger and keep an eye on the nail.  The glue will wear off over the next couple of weeks.  Your nail will eventually fall off on its own as the new nail grows in.  If you notice worsening redness, pain, swelling, drainage from the nailbed, please return to the emergency department.    Subchorionic Hematoma  A subchorionic hematoma is a gathering of blood between the outer wall of the embryo (chorion) and the inner wall of the womb (uterus). This condition can cause vaginal bleeding. If they cause little or no vaginal bleeding, early small hematomas usually shrink on their own and do not affect your baby or pregnancy. When bleeding starts later in pregnancy, or if the hematoma is larger or occurs in older pregnant women, the condition may be more serious. Larger hematomas may get bigger, which increases the chances of miscarriage. This condition also increases the risk of:  Premature separation of the placenta from the uterus.  Premature (preterm) labor.  Stillbirth. What are the causes? The exact cause of this condition is not known. It occurs when blood is trapped between the placenta and the uterine wall because the placenta has separated from the original site of implantation. What increases the risk? You are more likely to develop this condition if:  You were treated with fertility medicines.  You conceived through in vitro fertilization (IVF). What are the signs or symptoms? Symptoms of this condition include:  Vaginal spotting or bleeding.  Contractions of the uterus. These cause abdominal pain. Sometimes you may have no symptoms and the bleeding may only be seen when ultrasound images are taken (transvaginal ultrasound). How is this diagnosed? This condition is diagnosed based on a physical exam. This includes a pelvic exam. You may also have other tests, including:  Blood tests.  Urine tests.  Ultrasound of the  abdomen. How is this treated? Treatment for this condition can vary. Treatment may include:  Watchful waiting. You will be monitored closely for any changes in bleeding. During this stage: ? The hematoma may be reabsorbed by the body. ? The hematoma may separate the fluid-filled space containing the embryo (gestational sac) from the wall of the womb (endometrium).  Medicines.  Activity restriction. This may be needed until the bleeding stops. Follow these instructions at home:  Stay on bed rest if told to do so by your health care provider.  Do not lift anything that is heavier than 10 lbs. (4.5 kg) or as told by your health care provider.  Do not use any products that contain nicotine or tobacco, such as cigarettes and e-cigarettes. If you need help quitting, ask your health care provider.  Track and write down the number of pads you use each day and how soaked (saturated) they are.  Do not use tampons.  Keep all follow-up visits as told by your health care provider. This is important. Your health care provider may ask you to have follow-up blood tests or ultrasound tests or both. Contact a health care provider if:  You have any vaginal bleeding.  You have a fever. Get help right away if:  You have severe cramps in your stomach, back, abdomen, or pelvis.  You pass large clots or tissue. Save any tissue for your health care provider to look at.  You have more vaginal bleeding, and you faint or become lightheaded or weak. Summary  A subchorionic hematoma is a gathering of blood between the outer wall of  the placenta and the uterus.  This condition can cause vaginal bleeding.  Sometimes you may have no symptoms and the bleeding may only be seen when ultrasound images are taken.  Treatment may include watchful waiting, medicines, or activity restriction. This information is not intended to replace advice given to you by your health care provider. Make sure you discuss any  questions you have with your health care provider. Document Revised: 01/23/2017 Document Reviewed: 04/08/2016 Elsevier Patient Education  2020 [employer].

## 2019-05-29 DIAGNOSIS — Z3A01 Less than 8 weeks gestation of pregnancy: Secondary | ICD-10-CM

## 2019-05-29 DIAGNOSIS — O418X10 Other specified disorders of amniotic fluid and membranes, first trimester, not applicable or unspecified: Secondary | ICD-10-CM

## 2019-05-29 LAB — HCG, QUANTITATIVE, PREGNANCY: hCG, Beta Chain, Quant, S: 11086 m[IU]/mL — ABNORMAL HIGH (ref <5)

## 2019-05-29 MED ORDER — PRENATAL PLUS IRON 29-1 MG PO TABS: 1.0000 | ORAL_TABLET | Freq: Every morning | ORAL | 3 refills | Status: DC

## 2019-05-29 MED ORDER — TERCONAZOLE 0.8 % VA CREA: 1.0000 | TOPICAL_CREAM | Freq: Every day | VAGINAL | 0 refills | Status: DC

## 2019-05-30 LAB — GC/CHLAMYDIA PROBE AMP (~~LOC~~) NOT AT ARMC
Chlamydia: NEGATIVE
Comment: NEGATIVE
Comment: NORMAL
Neisseria Gonorrhea: NEGATIVE

## 2019-06-16 LAB — OB RESULTS CONSOLE ABO/RH: RH Type: POSITIVE

## 2019-06-16 LAB — OB RESULTS CONSOLE HIV ANTIBODY (ROUTINE TESTING): HIV: NONREACTIVE

## 2019-06-16 LAB — OB RESULTS CONSOLE ANTIBODY SCREEN: Antibody Screen: NEGATIVE

## 2019-06-16 LAB — OB RESULTS CONSOLE RPR: RPR: NONREACTIVE

## 2019-06-16 LAB — OB RESULTS CONSOLE RUBELLA ANTIBODY, IGM: Rubella: IMMUNE

## 2019-06-16 LAB — OB RESULTS CONSOLE VARICELLA ZOSTER ANTIBODY, IGG: Varicella: IMMUNE

## 2019-06-16 LAB — OB RESULTS CONSOLE HEPATITIS B SURFACE ANTIGEN: Hepatitis B Surface Ag: NEGATIVE

## 2019-07-07 ENCOUNTER — Ambulatory Visit (INDEPENDENT_AMBULATORY_CARE_PROVIDER_SITE_OTHER): Payer: Medicaid Other | Admitting: Obstetrics

## 2019-07-07 ENCOUNTER — Other Ambulatory Visit: Payer: Self-pay

## 2019-07-07 ENCOUNTER — Encounter: Payer: Self-pay | Admitting: Obstetrics

## 2019-07-07 VITALS — BP 118/81 | HR 70 | Wt 168.0 lb

## 2019-07-07 DIAGNOSIS — Z3481 Encounter for supervision of other normal pregnancy, first trimester: Secondary | ICD-10-CM | POA: Diagnosis not present

## 2019-07-07 DIAGNOSIS — Z3A11 11 weeks gestation of pregnancy: Secondary | ICD-10-CM

## 2019-07-07 DIAGNOSIS — Z8751 Personal history of pre-term labor: Secondary | ICD-10-CM

## 2019-07-07 DIAGNOSIS — O0990 Supervision of high risk pregnancy, unspecified, unspecified trimester: Secondary | ICD-10-CM | POA: Insufficient documentation

## 2019-07-07 DIAGNOSIS — Z8759 Personal history of other complications of pregnancy, childbirth and the puerperium: Secondary | ICD-10-CM

## 2019-07-07 MED ORDER — BLOOD PRESSURE MONITOR KIT: 1.0000 | PACK | 0 refills | Status: DC

## 2019-07-07 MED ORDER — ASPIRIN 81 MG PO CHEW: 81.0000 mg | CHEWABLE_TABLET | Freq: Every day | ORAL | 5 refills | Status: DC

## 2019-07-07 NOTE — Progress Notes (Signed)
NOB  This Pregnancy was Not a  Planned.  Pt had NOB labs done on 06/16/19 at another office. ([facility] [place])   Pt will need to sign ROI   U/S on 06/24/19  *Last pap: 06/30/19  WNL   Genetic Screening: Desires   CC: Recurrent BV

## 2019-07-07 NOTE — Progress Notes (Addendum)
Subjective:    Briana Wagner is being seen today for her first obstetrical visit.  This is not a planned pregnancy. She is at 5w6dgestation. Her obstetrical history is significant for previous preterm delivery at 33 weeks and previous placental abruption. Relationship with FOB: significant other, not living together. Patient does intend to breast feed. Pregnancy history fully reviewed.  The information documented in the HPI was reviewed and verified.  Menstrual History: OB History    Gravida  4   Para  1   Term      Preterm  1   AB  2   Living  1     SAB      TAB  2   Ectopic      Multiple  0   Live Births  1            Patient's last menstrual period was 04/15/2019.    Past Medical History:  Diagnosis Date  . Asthma   . Bronchitis   . BV (bacterial vaginosis)   . Chlamydia     Past Surgical History:  Procedure Laterality Date  . abortion     x2  . [place]  2014  . WISDOM TOOTH EXTRACTION      (Not in a hospital admission)  Allergies  Allergen Reactions  . Shellfish Allergy Hives and Swelling    Social History   Tobacco Use  . Smoking status: Current Every Day Smoker    Packs/day: 0.25    Years: 9.00    Pack years: 2.25    Types: Cigarettes  . Smokeless tobacco: Never Used  Substance Use Topics  . Alcohol use: No    Family History  Problem Relation Age of Onset  . Hypertension Mother   . Aneurysm Mother   . Stroke Mother   . Heart disease Father        3 MIs, stints  . Hypertension Father   . COPD Father      Review of Systems Constitutional: negative for weight loss Gastrointestinal: negative for vomiting Genitourinary:negative for genital lesions and vaginal discharge and dysuria Musculoskeletal:negative for back pain Behavioral/Psych: negative for abusive relationship, depression, illegal drug usage and tobacco use    Objective:    BP 118/81   Pulse 70   Wt 168 lb (76.2 kg)   LMP 04/15/2019 Comment:  pt shielded  BMI 29.76 kg/m  General Appearance:    Alert, cooperative, no distress, appears stated age  Head:    Normocephalic, without obvious abnormality, atraumatic  Eyes:    PERRL, conjunctiva/corneas clear, EOM's intact, fundi    benign, both eyes  Ears:    Normal TM's and external ear canals, both ears  Nose:   Nares normal, septum midline, mucosa normal, no drainage    or sinus tenderness  Throat:   Lips, mucosa, and tongue normal; teeth and gums normal  Neck:   Supple, symmetrical, trachea midline, no adenopathy;    thyroid:  no enlargement/tenderness/nodules; no carotid   bruit or JVD  Back:     Symmetric, no curvature, ROM normal, no CVA tenderness  Lungs:     Clear to auscultation bilaterally, respirations unlabored  Chest Wall:    No tenderness or deformity   Heart:    Regular rate and rhythm, S1 and S2 normal, no murmur, rub   or gallop  Breast Exam:    No tenderness, masses, or nipple abnormality  Abdomen:     Soft, non-tender, bowel sounds  active all four quadrants,    no masses, no organomegaly  Genitalia:    Normal female without lesion, discharge or tenderness  Extremities:   Extremities normal, atraumatic, no cyanosis or edema  Pulses:   2+ and symmetric all extremities  Skin:   Skin color, texture, turgor normal, no rashes or lesions  Lymph nodes:   Cervical, supraclavicular, and axillary nodes normal  Neurologic:   CNII-XII intact, normal strength, sensation and reflexes    throughout      Lab Review Urine pregnancy test Labs reviewed yes Radiologic studies reviewed yes  Assessment:    Pregnancy at 69w6dweeks    Plan:     .1. Supervision of high risk pregnancy, antepartum Rx: - Genetic Screening - Enroll Patient in Babyscripts - Blood Pressure Monitor KIT; 1 Device by Does not apply route once a week. To be monitored Regularly at home.  Dispense: 1 kit; Refill: 0  2. History of placental abruption Rx: - aspirin 81 MG chewable tablet; Chew 1  tablet (81 mg total) by mouth daily.  Dispense: 30 tablet; Refill: 5 - [place] Comp Less 14 Wks; Future  3. History of preterm delivery Rx: - aspirin 81 MG chewable tablet; Chew 1 tablet (81 mg total) by mouth daily.  Dispense: 30 tablet; Refill: 5 - [place] Comp Less 14 Wks; Future  Prenatal vitamins.  Counseling provided regarding continued use of seat belts, cessation of alcohol consumption, smoking or use of illicit drugs; infection precautions i.e., influenza/TDAP immunizations, toxoplasmosis,CMV, parvovirus, listeria and varicella; workplace safety, exercise during pregnancy; routine dental care, safe medications, sexual activity, hot tubs, saunas, pools, travel, caffeine use, fish and methlymercury, potential toxins, hair treatments, varicose veins Weight gain recommendations per IOM guidelines reviewed: underweight/BMI< 18.5--> gain 28 - 40 lbs; normal weight/BMI 18.5 - 24.9--> gain 25 - 35 lbs; overweight/BMI 25 - 29.9--> gain 15 - 25 lbs; obese/BMI >30->gain  11 - 20 lbs Problem list reviewed and updated. FIRST/CF mutation testing/NIPT/QUAD SCREEN/fragile X/Ashkenazi Jewish population testing/Spinal muscular atrophy discussed: requested. Role of ultrasound in pregnancy discussed; fetal survey: requested. Amniocentesis discussed: not indicated.  Meds ordered this encounter  Medications  . Blood Pressure Monitor KIT    Sig: 1 Device by Does not apply route once a week. To be monitored Regularly at home.    Dispense:  1 kit    Refill:  0  . aspirin 81 MG chewable tablet    Sig: Chew 1 tablet (81 mg total) by mouth daily.    Dispense:  30 tablet    Refill:  5   Orders Placed This Encounter  Procedures  . [place] Comp Less 14 Wks    Standing Status:   Future    Standing Expiration Date:   09/05/2020    Order Specific Question:   Reason for Exam (SYMPTOM  OR DIAGNOSIS REQUIRED)    Answer:   History of preterm delivery with placental abruption    Order Specific Question:   Preferred  Imaging Location?    Answer:   [facility]  . [place]OB Transvaginal    Standing Status:   Future    Standing Expiration Date:   09/05/2020    Order Specific Question:   Reason for Exam (SYMPTOM  OR DIAGNOSIS REQUIRED)    Answer:   History of preterm delivery with placental abruption    Order Specific Question:   Preferred Imaging Location?    Answer:   Internal  . Genetic Screening    [facility]  Follow up in 4 weeks. 50% of 25 min visit spent on counseling and coordination of care.      Shelly Bombard, MD 07/07/2019 3:11 PM

## 2019-07-07 NOTE — Addendum Note (Signed)
Addended by: Coral Ceo A on: 07/07/2019 03:11 PM   Modules accepted: Orders

## 2019-07-13 ENCOUNTER — Encounter: Payer: Self-pay | Admitting: Obstetrics

## 2019-07-14 ENCOUNTER — Ambulatory Visit
Admission: RE | Admit: 2019-07-14 | Discharge: 2019-07-14 | Disposition: A | Discharge status: Home / Self Care | Payer: Medicaid Other | Source: Ambulatory Visit | Attending: Obstetrics | Admitting: Obstetrics

## 2019-07-14 ENCOUNTER — Other Ambulatory Visit: Payer: Self-pay

## 2019-07-14 DIAGNOSIS — Z8759 Personal history of other complications of pregnancy, childbirth and the puerperium: Secondary | ICD-10-CM | POA: Diagnosis not present

## 2019-07-14 DIAGNOSIS — Z8751 Personal history of pre-term labor: Secondary | ICD-10-CM | POA: Diagnosis present

## 2019-07-18 ENCOUNTER — Encounter: Payer: Self-pay | Admitting: Obstetrics

## 2019-08-04 ENCOUNTER — Telehealth (INDEPENDENT_AMBULATORY_CARE_PROVIDER_SITE_OTHER): Payer: Medicaid Other

## 2019-08-04 DIAGNOSIS — B9689 Other specified bacterial agents as the cause of diseases classified elsewhere: Secondary | ICD-10-CM

## 2019-08-04 DIAGNOSIS — O23592 Infection of other part of genital tract in pregnancy, second trimester: Secondary | ICD-10-CM

## 2019-08-04 DIAGNOSIS — O09212 Supervision of pregnancy with history of pre-term labor, second trimester: Secondary | ICD-10-CM

## 2019-08-04 DIAGNOSIS — Z8751 Personal history of pre-term labor: Secondary | ICD-10-CM

## 2019-08-04 DIAGNOSIS — Z3A15 15 weeks gestation of pregnancy: Secondary | ICD-10-CM

## 2019-08-04 DIAGNOSIS — Z8759 Personal history of other complications of pregnancy, childbirth and the puerperium: Secondary | ICD-10-CM

## 2019-08-04 DIAGNOSIS — Z3490 Encounter for supervision of normal pregnancy, unspecified, unspecified trimester: Secondary | ICD-10-CM

## 2019-08-04 MED ORDER — METRONIDAZOLE 500 MG PO TABS: 500.0000 mg | ORAL_TABLET | Freq: Two times a day (BID) | ORAL | 0 refills | Status: DC

## 2019-08-04 NOTE — Progress Notes (Signed)
OBSTETRICS PRENATAL VIRTUAL VISIT ENCOUNTER NOTE  Provider location: [facility] [place]   I connected with [name] on 08/04/19 at  9:55 AM EDT by MyChart Video Encounter at home and verified that I am speaking with the correct person using two identifiers.   I discussed the limitations, risks, security and privacy concerns of performing an evaluation and management service virtually and the availability of in person appointments. I also discussed with the patient that there may be a patient responsible charge related to this service. The patient expressed understanding and agreed to proceed. Subjective:  Briana Wagner is a 29 y.o. [phone] at [redacted]w[redacted]d being seen today for ongoing prenatal care.  She is currently monitored for the following issues for this high-risk pregnancy and has Subchorionic hemorrhage in first trimester; Vaginal bleeding during pregnancy, antepartum; [redacted] weeks gestation of pregnancy; Abruptio placenta; Vaginal bleeding in pregnancy; [redacted] weeks gestation of pregnancy; Preterm labor; NVD (normal vaginal delivery); and Supervision of normal pregnancy on their problem list.  Patient reports vaginal discharge. Patient states she was diagnosed with BV at her initial OB with [facility] providers, but never received medications.  Patient reports vaginal discharge continues.  Patient denies vaginal bleeding or other concerns.  Contractions: Not present. Vag. Bleeding: None.  Movement: Present. Denies any leaking of fluid.   The following portions of the patient's history were reviewed and updated as appropriate: allergies, current medications, past family history, past medical history, past social history, past surgical history and problem list.   Objective:  There were no vitals filed for this visit.  Fetal Status:     Movement: Present     General:  Alert, oriented and cooperative. Patient is in no acute distress.  Respiratory: Normal respiratory effort, no  problems with respiration noted  Mental Status: Normal mood and affect. Normal behavior. Normal judgment and thought content.  Rest of physical exam deferred due to type of encounter  Imaging: US OB Comp Less 14 Wks  Result Date: 07/14/2019 CLINICAL DATA:  History of placental abruption and pre term delivery. Gestational age by last menstrual period is 12 weeks 6 days. EXAM: OBSTETRIC <14 WK ULTRASOUND TECHNIQUE: Transabdominal ultrasound was performed for evaluation of the gestation as well as the maternal uterus and adnexal regions. COMPARISON:  Obstetric ultrasound dated 05/28/2019. FINDINGS: Intrauterine gestational sac: Single Yolk sac:  Visualized. Embryo:  Visualized. Cardiac Activity: Visualized. Heart Rate: 157 bpm CRL:   59.1 mm   12 w 3 d                  [place] EDC: 01/23/2020 Subchorionic hemorrhage:  None visualized. Maternal uterus/adnexae: No abnormality IMPRESSION: Single intrauterine pregnancy without evidence of subchorionic hemorrhage. Electronically Signed   By: Romona Curls M.D.   On: 07/14/2019 16:39    Assessment and Plan:  Pregnancy: [id number] at [redacted]w[redacted]d 1. Encounter for supervision of normal pregnancy, antepartum, unspecified gravidity -Anatomy US scheduled. -Reviewed history of PTD and placental abruption.  Will plan for detailed anatomy. -Anticipatory guidance for upcoming appts.   2. History of preterm delivery -Extensive discussion regarding initiation of 17-p injections. -Provider strongly recommends and patient agrees. -Will start injections on Monday.  Informed that weekly injections will be every Monday.  -Message sent to office staff for scheduling accordingly.   3. Bacterial vaginosis in pregnancy -Diagnosed at previous provider.   -Rx for Metronidazole sent to pharmacy on file.    Preterm labor symptoms and general obstetric precautions including but not  limited to vaginal bleeding, contractions, leaking of fluid and fetal movement were reviewed in detail  with the patient. I discussed the assessment and treatment plan with the patient. The patient was provided an opportunity to ask questions and all were answered. The patient agreed with the plan and demonstrated an understanding of the instructions. The patient was advised to call back or seek an in-person office evaluation/go to [facility]U at [facility] for any urgent or concerning symptoms. Please refer to After Visit Summary for other counseling recommendations.   I provided 9 minutes of face-to-face time during this encounter.  No follow-ups on file.  Future Appointments  Date Time Provider [place]  08/04/2019  9:55 AM Gavin Pound, CNM [place], [place] for [employer], [place]

## 2019-08-04 NOTE — Progress Notes (Signed)
Virtual ROB     CC: pt requesting BV Rx refill for Flagyl.  Notes vaginal discharge.  Pt needs to discuss aspirin management.

## 2019-08-04 NOTE — Patient Instructions (Signed)
Hydroxyprogesterone caproate injection for pregnancy What is this medicine? HYDROXYPROGESTERONE (hye drox ee proe JES ter one) is a female hormone. This medicine is used in women who are pregnant and who have delivered a baby too early (preterm) in the past. It helps lower the risk of having a preterm baby again. This medicine may be used for other purposes; ask your health care provider or pharmacist if you have questions. COMMON BRAND NAME(S): Makena What should I tell my health care provider before I take this medicine? They need to know if you have any of these conditions:  breast, cervical, uterine, or vaginal cancer  depression  diabetes or prediabetes  heart disease  high blood pressure  history of blood clots  kidney disease  liver disease  lung or breathing disease, like asthma  migraine headaches  seizures  vaginal bleeding  an unusual or allergic reaction to hydroxyprogesterone, other hormones, castor oil, benzyl alcohol, other medicines, foods, dyes, or preservatives  breast-feeding How should I use this medicine? This medicine is for injection into a muscle or under the skin. You will receive an injection once every week (every 7 days) as directed during your pregnancy. It is given by a health care professional in a hospital or clinic setting. Talk to your pediatrician regarding the use of this medicine in children. While this drug may be prescribed for pregnant women as young as 16 years, precautions do apply. Overdosage: If you think you have taken too much of this medicine contact a poison control center or emergency room at once. NOTE: This medicine is only for you. Do not share this medicine with others. What if I miss a dose? It is important not to miss your dose. Call your doctor or health care professional if you are unable to keep an appointment. What may interact with this medicine? Significant interactions are not expected. This list may not  describe all possible interactions. Give your health care provider a list of all the medicines, herbs, non-prescription drugs, or dietary supplements you use. Also tell them if you smoke, drink alcohol, or use illegal drugs. Some items may interact with your medicine. What should I watch for while using this medicine? Your pregnancy will be monitored carefully while you are receiving this medicine. What side effects may I notice from receiving this medicine? Side effects that you should report to your doctor or health care professional as soon as possible:  allergic reactions like skin rash, itching or hives, swelling of the face, lips, or tongue  breathing problems  depressed mood  increase in blood pressure  increased hunger or thirst  increased urination  signs and symptoms of a blood clot such as breathing problems; changes in vision; chest pain; severe, sudden headache; pain, swelling, warmth in the leg; trouble speaking; sudden numbness or weakness of the face, arm or leg  unusually weak or tired  unusual vaginal bleeding  yellowing of the eyes or skin Side effects that usually do not require medical attention (report to your doctor or health care professional if they continue or are bothersome):  diarrhea  fluid retention and swelling  nausea  pain, redness, or irritation at site where injected This list may not describe all possible side effects. Call your doctor for medical advice about side effects. You may report side effects to FDA at 1-800-FDA-1088. Where should I keep my medicine? This drug is given in a hospital or clinic and will not be stored at home. NOTE: This sheet is a   summary. It may not cover all possible information. If you have questions about this medicine, talk to your doctor, pharmacist, or health care provider.  2020 Elsevier/Gold Standard (2016-10-26 11:14:47)   

## 2019-08-08 ENCOUNTER — Ambulatory Visit: Payer: Medicaid Other

## 2019-08-11 ENCOUNTER — Other Ambulatory Visit: Payer: Self-pay

## 2019-08-11 ENCOUNTER — Ambulatory Visit (INDEPENDENT_AMBULATORY_CARE_PROVIDER_SITE_OTHER): Payer: Medicaid Other

## 2019-08-11 ENCOUNTER — Encounter (HOSPITAL_COMMUNITY): Payer: Self-pay

## 2019-08-11 DIAGNOSIS — Z3A16 16 weeks gestation of pregnancy: Secondary | ICD-10-CM

## 2019-08-11 DIAGNOSIS — O09212 Supervision of pregnancy with history of pre-term labor, second trimester: Secondary | ICD-10-CM

## 2019-08-11 DIAGNOSIS — Z8751 Personal history of pre-term labor: Secondary | ICD-10-CM

## 2019-08-11 DIAGNOSIS — Z8759 Personal history of other complications of pregnancy, childbirth and the puerperium: Secondary | ICD-10-CM | POA: Insufficient documentation

## 2019-08-11 DIAGNOSIS — O09899 Supervision of other high risk pregnancies, unspecified trimester: Secondary | ICD-10-CM | POA: Insufficient documentation

## 2019-08-11 MED ORDER — HYDROXYPROGESTERONE CAPROATE 275 MG/1.1ML ~~LOC~~ SOAJ
275.0000 mg | Freq: Once | SUBCUTANEOUS | Status: AC
  Administered 2019-08-11: 275 mg via SUBCUTANEOUS

## 2019-08-11 NOTE — Progress Notes (Addendum)
Patient presents for 17-p injection, given in RA tolerated well.  Administrations This Visit    HYDROXYprogesterone caproate (Makena) autoinjector 275 mg    Admin Date 08/11/2019 Action Given Dose 275 mg Route Subcutaneous Administered By Maretta Bees, RMA              Patient was assessed and managed by nursing staff during this encounter. I have reviewed the chart and agree with the documentation and plan. I have also made any necessary editorial changes.  Marylen Ponto, NP 08/11/2019 4:46 PM

## 2019-08-18 ENCOUNTER — Ambulatory Visit (INDEPENDENT_AMBULATORY_CARE_PROVIDER_SITE_OTHER): Payer: Medicaid Other | Admitting: *Deleted

## 2019-08-18 ENCOUNTER — Other Ambulatory Visit: Payer: Self-pay

## 2019-08-18 VITALS — BP 119/80 | HR 83

## 2019-08-18 DIAGNOSIS — Z3A17 17 weeks gestation of pregnancy: Secondary | ICD-10-CM

## 2019-08-18 DIAGNOSIS — Z3490 Encounter for supervision of normal pregnancy, unspecified, unspecified trimester: Secondary | ICD-10-CM

## 2019-08-18 DIAGNOSIS — O09212 Supervision of pregnancy with history of pre-term labor, second trimester: Secondary | ICD-10-CM | POA: Diagnosis not present

## 2019-08-18 DIAGNOSIS — Z8751 Personal history of pre-term labor: Secondary | ICD-10-CM

## 2019-08-18 MED ORDER — HYDROXYPROGESTERONE CAPROATE 275 MG/1.1ML ~~LOC~~ SOAJ
275.0000 mg | SUBCUTANEOUS | Status: AC
  Administered 2019-08-18 – 2019-12-01 (×11): 275 mg via SUBCUTANEOUS

## 2019-08-18 NOTE — Progress Notes (Signed)
Pt is in office for 17p injection.  Pt tolerated injection well.   Pt has no other concerns today.   BP 119/80   Pulse 83   LMP 04/15/2019 Comment: pt shielded   Administrations This Visit    HYDROXYprogesterone caproate (Makena) autoinjector 275 mg    Admin Date 08/18/2019 Action Given Dose 275 mg Route Subcutaneous Administered By Lanney Gins, CMA

## 2019-08-25 ENCOUNTER — Ambulatory Visit: Payer: Medicaid Other

## 2019-08-31 ENCOUNTER — Ambulatory Visit: Payer: Medicaid Other | Attending: Obstetrics and Gynecology

## 2019-08-31 ENCOUNTER — Other Ambulatory Visit: Payer: Self-pay | Admitting: *Deleted

## 2019-08-31 ENCOUNTER — Other Ambulatory Visit: Payer: Self-pay

## 2019-08-31 ENCOUNTER — Ambulatory Visit: Payer: Medicaid Other | Admitting: *Deleted

## 2019-08-31 DIAGNOSIS — O09212 Supervision of pregnancy with history of pre-term labor, second trimester: Secondary | ICD-10-CM

## 2019-08-31 DIAGNOSIS — Z8759 Personal history of other complications of pregnancy, childbirth and the puerperium: Secondary | ICD-10-CM

## 2019-08-31 DIAGNOSIS — Z3490 Encounter for supervision of normal pregnancy, unspecified, unspecified trimester: Secondary | ICD-10-CM

## 2019-08-31 DIAGNOSIS — O09292 Supervision of pregnancy with other poor reproductive or obstetric history, second trimester: Secondary | ICD-10-CM

## 2019-08-31 DIAGNOSIS — Z8751 Personal history of pre-term labor: Secondary | ICD-10-CM | POA: Diagnosis present

## 2019-08-31 DIAGNOSIS — Z363 Encounter for antenatal screening for malformations: Secondary | ICD-10-CM

## 2019-08-31 DIAGNOSIS — O4592 Premature separation of placenta, unspecified, second trimester: Secondary | ICD-10-CM

## 2019-08-31 DIAGNOSIS — Z3A19 19 weeks gestation of pregnancy: Secondary | ICD-10-CM

## 2019-08-31 DIAGNOSIS — Z362 Encounter for other antenatal screening follow-up: Secondary | ICD-10-CM

## 2019-09-01 ENCOUNTER — Ambulatory Visit (INDEPENDENT_AMBULATORY_CARE_PROVIDER_SITE_OTHER): Payer: Medicaid Other

## 2019-09-01 VITALS — Wt 176.0 lb

## 2019-09-01 DIAGNOSIS — O09212 Supervision of pregnancy with history of pre-term labor, second trimester: Secondary | ICD-10-CM | POA: Diagnosis not present

## 2019-09-01 DIAGNOSIS — Z3A19 19 weeks gestation of pregnancy: Secondary | ICD-10-CM

## 2019-09-01 DIAGNOSIS — Z8751 Personal history of pre-term labor: Secondary | ICD-10-CM

## 2019-09-01 NOTE — Progress Notes (Addendum)
SUBJECTIVE OB presents for 17P injection, given in RA, tolerated well.  PLAN RTO in 1 week for 17P.  Administrations This Visit    HYDROXYprogesterone caproate (Makena) autoinjector 275 mg    Admin Date 09/01/2019 Action Given Dose 275 mg Route Subcutaneous Administered By Maretta Bees, RMA              Patient was assessed and managed by nursing staff during this encounter. I have reviewed the chart and agree with the documentation and plan. I have also made any necessary editorial changes.  Marylen Ponto, NP 09/01/2019 3:31 PM

## 2019-09-04 ENCOUNTER — Other Ambulatory Visit: Payer: Self-pay

## 2019-09-04 ENCOUNTER — Encounter (HOSPITAL_COMMUNITY): Payer: Self-pay | Admitting: Emergency Medicine

## 2019-09-04 ENCOUNTER — Inpatient Hospital Stay (HOSPITAL_COMMUNITY)
Admission: EM | Admit: 2019-09-04 | Discharge: 2019-09-04 | Disposition: A | Discharge status: Home / Self Care | Payer: Medicaid Other | Attending: Obstetrics and Gynecology | Admitting: Obstetrics and Gynecology

## 2019-09-04 DIAGNOSIS — O9A212 Injury, poisoning and certain other consequences of external causes complicating pregnancy, second trimester: Secondary | ICD-10-CM | POA: Diagnosis not present

## 2019-09-04 DIAGNOSIS — M545 Low back pain, unspecified: Secondary | ICD-10-CM

## 2019-09-04 DIAGNOSIS — O99512 Diseases of the respiratory system complicating pregnancy, second trimester: Secondary | ICD-10-CM | POA: Insufficient documentation

## 2019-09-04 DIAGNOSIS — F17200 Nicotine dependence, unspecified, uncomplicated: Secondary | ICD-10-CM

## 2019-09-04 DIAGNOSIS — O99332 Smoking (tobacco) complicating pregnancy, second trimester: Secondary | ICD-10-CM | POA: Diagnosis not present

## 2019-09-04 DIAGNOSIS — J45901 Unspecified asthma with (acute) exacerbation: Secondary | ICD-10-CM | POA: Diagnosis not present

## 2019-09-04 DIAGNOSIS — O99320 Drug use complicating pregnancy, unspecified trimester: Secondary | ICD-10-CM

## 2019-09-04 DIAGNOSIS — Z630 Problems in relationship with spouse or partner: Secondary | ICD-10-CM | POA: Diagnosis not present

## 2019-09-04 DIAGNOSIS — Z79899 Other long term (current) drug therapy: Secondary | ICD-10-CM | POA: Diagnosis not present

## 2019-09-04 DIAGNOSIS — J45909 Unspecified asthma, uncomplicated: Secondary | ICD-10-CM | POA: Diagnosis not present

## 2019-09-04 DIAGNOSIS — S3992XA Unspecified injury of lower back, initial encounter: Secondary | ICD-10-CM

## 2019-09-04 DIAGNOSIS — O9A312 Physical abuse complicating pregnancy, second trimester: Secondary | ICD-10-CM | POA: Insufficient documentation

## 2019-09-04 DIAGNOSIS — Z7982 Long term (current) use of aspirin: Secondary | ICD-10-CM | POA: Insufficient documentation

## 2019-09-04 DIAGNOSIS — F17210 Nicotine dependence, cigarettes, uncomplicated: Secondary | ICD-10-CM | POA: Insufficient documentation

## 2019-09-04 DIAGNOSIS — Z3A20 20 weeks gestation of pregnancy: Secondary | ICD-10-CM | POA: Diagnosis not present

## 2019-09-04 MED ORDER — ALBUTEROL SULFATE HFA 108 (90 BASE) MCG/ACT IN AERS: 2.0000 | INHALATION_SPRAY | RESPIRATORY_TRACT | 1 refills | Status: DC | PRN

## 2019-09-04 MED ORDER — ALBUTEROL SULFATE HFA 108 (90 BASE) MCG/ACT IN AERS
2.0000 | INHALATION_SPRAY | RESPIRATORY_TRACT | Status: DC | PRN
  Administered 2019-09-04: 2 via RESPIRATORY_TRACT
  Filled 2019-09-04: qty 6.7

## 2019-09-04 MED ORDER — ACETAMINOPHEN 325 MG PO TABS
650.0000 mg | ORAL_TABLET | Freq: Once | ORAL | Status: AC
  Administered 2019-09-04: 650 mg via ORAL
  Filled 2019-09-04: qty 2
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
## 2019-09-04 NOTE — ED Notes (Signed)
MSE by Dr. Charm Barges and report called to [facility] in MAU.  Transport called to take pt.
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
## 2019-09-04 NOTE — Discharge Instructions (Signed)
Intimate Partner Violence Information Intimate partner violence, also called domestic abuse or relationship abuse, is a pattern of behaviors used by one partner to gain or maintain power and control over the other partner. Intimate partner violence can happen to women and men and can happen between people who are or were:  Married.  Dating.  Living together. What are the types of intimate partner violence? Intimate partner violence can involve physical, emotional, psychological, sexual, and economic abuse, or stalking by a current or former partner. Different types of abuse can occur at the same time within the same relationship.  Physical abuse. This includes rough handling, threats with a weapon, throwing objects, pushing, or hitting.  Emotional and psychological abuse. This includes verbal attacks, rejection, humiliation, intimidation, social isolation, or threats. Abuse may also include limiting contact with family and friends.  Sexual assault. Sexual assault is any unwanted sexual activity that occurs without clear permission (consent) from both people. This includes unwanted touching and sexual harassment.  Economic abuse. This includes controlling money, food, transportation, or other belongings.  Stalking. This involves such things as repeated, unwanted phone calls, e-mails, or text messages, or watching the victim from a distance. What are some warning signs of intimate partner violence? Physical signs  Bruises.  Broken bones.  Burns or cuts.  Physical pain.  Head injury. Emotional and psychological signs  Crying.  Depression.  Hopelessness.  Desperation.  Trouble sleeping.  Fear of the partner.  Anxiety.  Suicidal thoughts or behavior.  Antisocial behavior.  Low self-esteem.  Fear of intimacy.  Flashbacks. Sexual signs  Bruising, swelling, or bleeding of the genital or rectal area.  Signs of an STI, such as genital sores, warts, or discharge  coming from the genital area.  Pain in the genital area.  Unintended pregnancy.  Problems with pregnancy. What are common behaviors of those affected by intimate partner violence? Those affected by intimate partner violence may:  Be late to work or other events.  Not show up to places as promised.  Have to let their partner know where they are and who they are with.  Be isolated or kept from seeing friends or family.  Make comments about their partner's temper or behavior.  Make excuses for their partner.  Engage in high-risk sexual behaviors.  Use drugs or alcohol.  Have unhealthy eating behaviors. What are common feelings of those affected by intimate partner violence? Victims of intimate partner violence may feel that they:  Must be careful not to say or do things that trigger their partner's anger.  Cannot do anything right.  Deserve to be treated badly.  Overreact to their partner's behavior or temper.  Cannot trust their own feelings.  Cannot trust other people.  Are trapped.  May have their children taken away by their partner.  Are emotionally drained or numb.  Are in danger.  Might have to kill their partner to survive. Where can you get help? If you do not feel safe searching for help online at home, use a computer at a public library to access the Internet. Call 911 if you are in immediate danger or need medical help. Intimate partner violence hotlines and websites  The National Domestic Violence Hotline. ? 24-hour phone hotline: 1-800-799-7233 (SAFE) or 1-800-787-3224 (TTY). ? Videophone: available Monday through Friday, 9 a.m. to 5 p.m. Call 1-855-812-1001. ? thehotline.org  The National Sexual Assault Hotline. ? 24-hour phone hotline: 1-800-656-4673. ? safehelpline.org Shelters for victims of intimate partner violence If you are a victim   of intimate partner violence, there are resources to help you find a temporary place for you and your  children to live (shelter). The specific address of these shelters is often not known to the public. Police Report assaults, threats, and stalking to the police. Counselors and counseling centers  Counseling can help you cope with difficult emotions and empower you to plan for your future safety. The topics you discuss with a counselor are private and confidential. Children of intimate partner violence victims also might need counseling to manage stress and anxiety. The court system You can work with a lawyer or an advocate to get legal protection against an abuser. Protection includes restraining orders and private addresses. Crimes against you, such as assault, can also be prosecuted through the courts. Laws vary by state. Follow these instructions at home:  Create a safety plan that includes ways to remain safe while you are in an abusive relationship, while you are planning to leave, or after you leave. This plan may be created by the victim alone or with assistance from the domestic violence hotline staff or local shelter staff. Your safety plan may include: ? How to cope with emotions. ? How to tell friends and family about the abuse. ? How to take legal action. ? How to create a safe home environment. ? How to keep your children safe. ? Emergency plans for life-threatening situations. Get help right away if you:  Feel like you are in immediate danger.  Feel like you may hurt yourself or others. If you ever feel like you may hurt yourself or others, or have thoughts about taking your own life, get help right away. You can go to your nearest emergency department or call:  Your local emergency services (911 in the [place]  A suicide crisis helpline, such as the National Suicide Prevention Lifeline at 1-800-273-8255. This is open 24 hours a day. Summary  If you are a victim of intimate partner violence, there are resources to help you find a temporary place for you and your children to  live (shelter).  Create a safety plan that includes ways to remain safe while you are in an abusive relationship, while you are planning to leave, or after you leave. This information is not intended to replace advice given to you by your health care provider. Make sure you discuss any questions you have with your health care provider. Document Revised: 07/20/2018 Document Reviewed: 03/27/2017 Elsevier Patient Education  2020 Elsevier Inc.  
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
## 2019-09-08 ENCOUNTER — Ambulatory Visit (INDEPENDENT_AMBULATORY_CARE_PROVIDER_SITE_OTHER): Payer: Medicaid Other

## 2019-09-08 ENCOUNTER — Other Ambulatory Visit: Payer: Self-pay

## 2019-09-08 VITALS — BP 114/73 | HR 81 | Wt 175.0 lb

## 2019-09-08 DIAGNOSIS — Z3A20 20 weeks gestation of pregnancy: Secondary | ICD-10-CM | POA: Diagnosis not present

## 2019-09-08 DIAGNOSIS — O09212 Supervision of pregnancy with history of pre-term labor, second trimester: Secondary | ICD-10-CM

## 2019-09-08 DIAGNOSIS — Z3480 Encounter for supervision of other normal pregnancy, unspecified trimester: Secondary | ICD-10-CM

## 2019-09-08 DIAGNOSIS — Z8751 Personal history of pre-term labor: Secondary | ICD-10-CM

## 2019-09-08 MED ORDER — HYDROXYPROGESTERONE CAPROATE 275 MG/1.1ML ~~LOC~~ SOAJ
275.0000 mg | Freq: Once | SUBCUTANEOUS | Status: AC
  Administered 2019-09-08: 275 mg via SUBCUTANEOUS

## 2019-09-08 NOTE — Patient Instructions (Signed)
Glucose Tolerance Test During Pregnancy Why am I having this test? The glucose tolerance test (GTT) is done to check how your body processes sugar (glucose). This is one of several tests used to diagnose diabetes that develops during pregnancy (gestational diabetes mellitus). Gestational diabetes is a temporary form of diabetes that some women develop during pregnancy. It usually occurs during the second trimester of pregnancy and goes away after delivery. Testing (screening) for gestational diabetes usually occurs between 24 and 28 weeks of pregnancy. You may have the GTT test after having a 1-hour glucose screening test if the results from that test indicate that you may have gestational diabetes. You may also have this test if:  You have a history of gestational diabetes.  You have a history of giving birth to very large babies or have experienced repeated fetal loss (stillbirth).  You have signs and symptoms of diabetes, such as: ? Changes in your vision. ? Tingling or numbness in your hands or feet. ? Changes in hunger, thirst, and urination that are not otherwise explained by your pregnancy. What is being tested? This test measures the amount of glucose in your blood at different times during a period of 3 hours. This indicates how well your body is able to process glucose. What kind of sample is taken?  Blood samples are required for this test. They are usually collected by inserting a needle into a blood vessel. How do I prepare for this test?  For 3 days before your test, eat normally. Have plenty of carbohydrate-rich foods.  Follow instructions from your health care provider about: ? Eating or drinking restrictions on the day of the test. You may be asked to not eat or drink anything other than water (fast) starting 8-10 hours before the test. ? Changing or stopping your regular medicines. Some medicines may interfere with this test. Tell a health care provider about:  All  medicines you are taking, including vitamins, herbs, eye drops, creams, and over-the-counter medicines.  Any blood disorders you have.  Any surgeries you have had.  Any medical conditions you have. What happens during the test? First, your blood glucose will be measured. This is referred to as your fasting blood glucose, since you fasted before the test. Then, you will drink a glucose solution that contains a certain amount of glucose. Your blood glucose will be measured again 1, 2, and 3 hours after drinking the solution. This test takes about 3 hours to complete. You will need to stay at the testing location during this time. During the testing period:  Do not eat or drink anything other than the glucose solution.  Do not exercise.  Do not use any products that contain nicotine or tobacco, such as cigarettes and e-cigarettes. If you need help stopping, ask your health care provider. The testing procedure may vary among health care providers and hospitals. How are the results reported? Your results will be reported as milligrams of glucose per deciliter of blood (mg/dL) or millimoles per liter (mmol/L). Your health care provider will compare your results to normal ranges that were established after testing a large group of people (reference ranges). Reference ranges may vary among labs and hospitals. For this test, common reference ranges are:  Fasting: less than 95-105 mg/dL (5.3-5.8 mmol/L).  1 hour after drinking glucose: less than 180-190 mg/dL (10.0-10.5 mmol/L).  2 hours after drinking glucose: less than 155-165 mg/dL (8.6-9.2 mmol/L).  3 hours after drinking glucose: 140-145 mg/dL (7.8-8.1 mmol/L). What do the   results mean? Results within reference ranges are considered normal, meaning that your glucose levels are well-controlled. If two or more of your blood glucose levels are high, you may be diagnosed with gestational diabetes. If only one level is high, your health care  provider may suggest repeat testing or other tests to confirm a diagnosis. Talk with your health care provider about what your results mean. Questions to ask your health care provider Ask your health care provider, or the department that is doing the test:  When will my results be ready?  How will I get my results?  What are my treatment options?  What other tests do I need?  What are my next steps? Summary  The glucose tolerance test (GTT) is one of several tests used to diagnose diabetes that develops during pregnancy (gestational diabetes mellitus). Gestational diabetes is a temporary form of diabetes that some women develop during pregnancy.  You may have the GTT test after having a 1-hour glucose screening test if the results from that test indicate that you may have gestational diabetes. You may also have this test if you have any symptoms or risk factors for gestational diabetes.  Talk with your health care provider about what your results mean. This information is not intended to replace advice given to you by your health care provider. Make sure you discuss any questions you have with your health care provider. Document Revised: 06/03/2018 Document Reviewed: 09/22/2016 Elsevier Patient Education  2020 Elsevier Inc.  

## 2019-09-08 NOTE — Progress Notes (Signed)
Pt presents for routine OB visit reports spotting 3 days ago, denies IC. No other episodes  17p due today  Makena given R arm without complaints
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
## 2019-09-15 ENCOUNTER — Ambulatory Visit: Payer: Medicaid Other

## 2019-09-20 ENCOUNTER — Other Ambulatory Visit: Payer: Self-pay

## 2019-09-20 ENCOUNTER — Ambulatory Visit (INDEPENDENT_AMBULATORY_CARE_PROVIDER_SITE_OTHER): Payer: Medicaid Other

## 2019-09-20 ENCOUNTER — Telehealth: Payer: Self-pay | Admitting: *Deleted

## 2019-09-20 DIAGNOSIS — Z3A22 22 weeks gestation of pregnancy: Secondary | ICD-10-CM

## 2019-09-20 DIAGNOSIS — O09899 Supervision of other high risk pregnancies, unspecified trimester: Secondary | ICD-10-CM

## 2019-09-20 DIAGNOSIS — O09212 Supervision of pregnancy with history of pre-term labor, second trimester: Secondary | ICD-10-CM

## 2019-09-20 NOTE — Progress Notes (Signed)
Pt is in the office for 17-p injection, administered in R arm and pt tolerated well .Marland Kitchen Administrations This Visit    HYDROXYprogesterone caproate (Makena) autoinjector 275 mg    Admin Date 09/20/2019 Action Given Dose 275 mg Route Subcutaneous Administered By Katrina Stack, RN

## 2019-09-20 NOTE — Telephone Encounter (Signed)
Call placed to pt to make her aware she needs to contact [facility] to speak with a case manager. [facility] has been unable to contact pt.  [facility] number was given to pt and asked her to call them this week.    Pt states understanding.

## 2019-09-20 NOTE — Progress Notes (Signed)
Agree with A [facility]. 

## 2019-09-26 ENCOUNTER — Other Ambulatory Visit: Payer: Self-pay

## 2019-09-26 ENCOUNTER — Ambulatory Visit: Payer: Medicaid Other | Admitting: *Deleted

## 2019-09-26 ENCOUNTER — Ambulatory Visit: Payer: Medicaid Other | Attending: Obstetrics and Gynecology

## 2019-09-26 ENCOUNTER — Encounter: Payer: Self-pay | Admitting: *Deleted

## 2019-09-26 ENCOUNTER — Other Ambulatory Visit: Payer: Self-pay | Admitting: *Deleted

## 2019-09-26 VITALS — BP 117/78 | HR 78

## 2019-09-26 DIAGNOSIS — Z8751 Personal history of pre-term labor: Secondary | ICD-10-CM

## 2019-09-26 DIAGNOSIS — O4592 Premature separation of placenta, unspecified, second trimester: Secondary | ICD-10-CM

## 2019-09-26 DIAGNOSIS — Z3A22 22 weeks gestation of pregnancy: Secondary | ICD-10-CM

## 2019-09-26 DIAGNOSIS — O09292 Supervision of pregnancy with other poor reproductive or obstetric history, second trimester: Secondary | ICD-10-CM

## 2019-09-26 DIAGNOSIS — Z362 Encounter for other antenatal screening follow-up: Secondary | ICD-10-CM

## 2019-09-26 DIAGNOSIS — O321XX0 Maternal care for breech presentation, not applicable or unspecified: Secondary | ICD-10-CM

## 2019-09-26 DIAGNOSIS — O09212 Supervision of pregnancy with history of pre-term labor, second trimester: Secondary | ICD-10-CM

## 2019-09-27 ENCOUNTER — Ambulatory Visit: Payer: Medicaid Other

## 2019-10-06 ENCOUNTER — Other Ambulatory Visit: Payer: Self-pay

## 2019-10-06 ENCOUNTER — Encounter: Payer: Self-pay | Admitting: Obstetrics & Gynecology

## 2019-10-06 ENCOUNTER — Ambulatory Visit (INDEPENDENT_AMBULATORY_CARE_PROVIDER_SITE_OTHER): Payer: Medicaid Other | Admitting: Obstetrics & Gynecology

## 2019-10-06 VITALS — BP 115/79 | HR 92 | Wt 182.0 lb

## 2019-10-06 DIAGNOSIS — Z3490 Encounter for supervision of normal pregnancy, unspecified, unspecified trimester: Secondary | ICD-10-CM | POA: Diagnosis not present

## 2019-10-06 DIAGNOSIS — Z3A24 24 weeks gestation of pregnancy: Secondary | ICD-10-CM

## 2019-10-06 DIAGNOSIS — O09899 Supervision of other high risk pregnancies, unspecified trimester: Secondary | ICD-10-CM

## 2019-10-06 DIAGNOSIS — Z8751 Personal history of pre-term labor: Secondary | ICD-10-CM

## 2019-10-06 DIAGNOSIS — O0992 Supervision of high risk pregnancy, unspecified, second trimester: Secondary | ICD-10-CM

## 2019-10-06 DIAGNOSIS — Z8759 Personal history of other complications of pregnancy, childbirth and the puerperium: Secondary | ICD-10-CM

## 2019-10-06 NOTE — Progress Notes (Signed)
PRENATAL VISIT NOTE  Subjective:  Briana Wagner is a 29 y.o. [phone] at [redacted]w[redacted]d being seen today for ongoing prenatal care.  She is currently monitored for the following issues for this high-risk pregnancy and has Vaginal bleeding in pregnancy; Supervision of high-risk pregnancy; History of preterm delivery, currently pregnant; and History of placental abruption on their problem list.  Patient reports no complaints.  Contractions: Not present. Vag. Bleeding: None.  Movement: Present. Denies leaking of fluid.   The following portions of the patient's history were reviewed and updated as appropriate: allergies, current medications, past family history, past medical history, past social history, past surgical history and problem list.   Objective:   Vitals:   10/06/19 0946  BP: 115/79  Pulse: 92  Weight: 182 lb (82.6 kg)    Fetal Status: Fetal Heart Rate (bpm): 144   Movement: Present     General:  Alert, oriented and cooperative. Patient is in no acute distress.  Skin: Skin is warm and dry. No rash noted.   Cardiovascular: Normal heart rate noted  Respiratory: Normal respiratory effort, no problems with respiration noted  Abdomen: Soft, gravid, appropriate for gestational age.  Pain/Pressure: Absent     Pelvic: Cervical exam deferred        Extremities: Normal range of motion.  Edema: None  Mental Status: Normal mood and affect. Normal behavior. Normal judgment and thought content.   Imaging: [place] MFM OB FOLLOW UP  Result Date: 09/26/2019 ----------------------------------------------------------------------  OBSTETRICS REPORT                       (Signed Final 09/26/2019 11:09 am) ---------------------------------------------------------------------- Patient Info  ID #:       [id number]                          D.O.B.:  1990/08/05 (28 yrs)  Name:       Briana Wagner                Visit Date: 09/26/2019 09:40 am ----------------------------------------------------------------------  Performed By  Attending:        Ma Rings MD         Ref. Address:     [address]                                                             [address]                                                             [place]  [id number]  Performed By:     Sandi MealyJovancia Adrien        [facility]  [facility]      [facility] ---------------------------------------------------------------------- Orders  #  Description                           Code        Ordered By  1  US MFM OB FOLLOW UP                   [id number]    YU FANG ----------------------------------------------------------------------  #  Order #                     Accession #                Episode #  1  [id number]                   [id number]                 [id number] ---------------------------------------------------------------------- Indications  Prior poor obstetrical history antepartum,     O09.292  second trimester (placental abruption)  Encounter for other antenatal screening        Z36.2  follow-up  Poor obstetric history (prior pre-term labor)  O09.219  Subchorionic hemorrhage, antepartum            O45.90  Poor obstetric history: Previous preterm       O09.219  delivery, antepartum (33 weeks)  [redacted] weeks gestation of pregnancy                Z3A.22 ---------------------------------------------------------------------- Fetal Evaluation  Num Of Fetuses:         1  Fetal Heart Rate(bpm):  150  Cardiac Activity:       Observed  Presentation:           Breech  Placenta:               Anterior  P. Cord Insertion:      Visualized  Amniotic Fluid  AFI FV:      Within normal limits                               Largest Pocket(cm)  4.32 ---------------------------------------------------------------------- Biometry  BPD:      55.8  mm     G. Age:  23w 0d         52  %    CI:        72.35   %    70 - 86                                                          FL/HC:      18.5   %    19.2 - 20.8  HC:      208.7  mm     G. Age:  23w 0d         40  %    HC/AC:      1.20        1.05 - 1.21  AC:      174.1  mm     G. Age:  22w 2d         26  %    FL/BPD:     69.2   %    71 - 87  FL:       38.6  mm     G. Age:  22w 3d         24  %    FL/AC:      22.2   %    20 - 24  HUM:      35.9  mm     G. Age:  22w 4d         35  %  LV:        5.3  mm  Est. FW:     504  gm      1 lb 2 oz     24  % ---------------------------------------------------------------------- OB History  Gravidity:    4         Term:   0        Prem:   1        SAB:   0  TOP:          2       Ectopic:  0        Living: 1 ---------------------------------------------------------------------- Gestational Age  LMP:           23w 3d        Date:  04/15/19                 EDD:   01/20/20  U/S Today:     22w 5d                                        EDD:   01/25/20  Best:          22w 6d     Det. ByMarcella Dubs         EDD:   01/24/20                                      (05/29/19) ---------------------------------------------------------------------- Anatomy  Cranium:  Appears normal         Aortic Arch:            Appears normal  Cavum:                 Appears normal         Ductal Arch:            Appears normal  Ventricles:            Appears normal         Diaphragm:              Appears normal  Choroid Plexus:        Previously seen        Stomach:                Appears normal, left                                                                        sided  Cerebellum:            Previously seen        Abdomen:                Appears normal  Posterior Fossa:       Previously seen        Abdominal Wall:          Previously seen  Nuchal Fold:           Not applicable (>20    Cord Vessels:           Previously seen                         wks GA)  Face:                  Orbits and profile     Kidneys:                Previously seen                         previously seen  Lips:                  Previously seen        Bladder:                Appears normal  Thoracic:              Appears normal         Spine:                  Previously seen  Heart:                 Echogenic focus        Upper Extremities:      Previously seen                         in LV  RVOT:                  Appears normal         Lower  Extremities:      Previously seen  LVOT:                  Appears normal  Other:  Female gender. Heels and 5th digit visualized. Open hands visualized.          Nasal bone visualized. ---------------------------------------------------------------------- Comments  This patient was seen for a follow up growth scan due to a  prior pregnancy that was complicated by placental abruption  and as the overall EFW measured on her last exam was in  the lower normal range.  She denies any problems since her  last exam.  She was informed that the fetal growth and amniotic fluid  level appears appropriate for her gestational age.  The anterior placenta appeared within normal limits.  A follow up exam was scheduled in 4 weeks. ----------------------------------------------------------------------                   Ma Rings, MD Electronically Signed Final Report   09/26/2019 11:09 am ----------------------------------------------------------------------   Assessment and Plan:  Pregnancy: [id number] at [redacted]w[redacted]d 1. History of preterm delivery, currently pregnant Received 17P, continue weekly injection.  2. History of placental abruption Doing well.  Last scan on 09/26/19 showed EFW 24%, follow up scheduled 10/24/19.  3. [redacted] weeks gestation of pregnancy 4. Supervision of high risk pregnancy in second trimester Preterm labor symptoms  and general obstetric precautions including but not limited to vaginal bleeding, contractions, leaking of fluid and fetal movement were reviewed in detail with the patient. Please refer to After Visit Summary for other counseling recommendations.   Return in about 1 week (around 10/13/2019) for 17P only (for following two weeks too)  4 weeks: 3rd trimester labs, OFFICE OB Visit, TDap, 17P.  Future Appointments  Date Time Provider Department Center  10/24/2019  9:30 AM [facility] NURSE [facility]  10/24/2019  9:45 AM [facility]    Jaynie Collins, MD

## 2019-10-06 NOTE — Patient Instructions (Signed)
Return to office for any scheduled appointments. Call the office or go to the MAU at [facility] if:  You begin to have strong, frequent contractions  Your water breaks.  Sometimes it is a big gush of fluid, sometimes it is just a trickle that keeps getting your panties wet or running down your legs  You have vaginal bleeding.  It is normal to have a small amount of spotting if your cervix was checked.   You do not feel your baby moving like normal.  If you do not, get something to eat and drink and lay down and focus on feeling your baby move.   If your baby is still not moving like normal, you should call the office or go to MAU.  Any other obstetric concerns.   

## 2019-10-06 NOTE — Progress Notes (Signed)
ROB/17P, given in LA, tolerated well.

## 2019-10-13 ENCOUNTER — Ambulatory Visit (INDEPENDENT_AMBULATORY_CARE_PROVIDER_SITE_OTHER): Payer: Medicaid Other | Admitting: *Deleted

## 2019-10-13 ENCOUNTER — Other Ambulatory Visit: Payer: Self-pay

## 2019-10-13 VITALS — BP 120/79 | HR 80 | Wt 185.0 lb

## 2019-10-13 DIAGNOSIS — O09899 Supervision of other high risk pregnancies, unspecified trimester: Secondary | ICD-10-CM

## 2019-10-13 DIAGNOSIS — O0992 Supervision of high risk pregnancy, unspecified, second trimester: Secondary | ICD-10-CM

## 2019-10-13 DIAGNOSIS — Z8751 Personal history of pre-term labor: Secondary | ICD-10-CM | POA: Diagnosis not present

## 2019-10-13 NOTE — Progress Notes (Signed)
Patient was assessed and managed by nursing staff during this encounter. I have reviewed the chart and agree with the documentation and plan.   Sharyon Cable, CNM 10/13/2019 10:26 AM

## 2019-10-13 NOTE — Progress Notes (Signed)
Pt is in office for 17p injection today. Injection given, pt tolerated well.  Pt is doing well with no other concerns today.  BP 120/79   Pulse 80   Wt 185 lb (83.9 kg)   LMP 04/15/2019 (Approximate) Comment: pt shielded  BMI 32.77 kg/m   Administrations This Visit    HYDROXYprogesterone caproate (Makena) autoinjector 275 mg    Admin Date 10/13/2019 Action Given Dose 275 mg Route Subcutaneous Administered By Lanney Gins, CMA

## 2019-10-20 ENCOUNTER — Other Ambulatory Visit: Payer: Self-pay

## 2019-10-20 ENCOUNTER — Ambulatory Visit (INDEPENDENT_AMBULATORY_CARE_PROVIDER_SITE_OTHER): Payer: Medicaid Other

## 2019-10-20 VITALS — BP 115/79 | HR 76 | Wt 182.0 lb

## 2019-10-20 DIAGNOSIS — O09899 Supervision of other high risk pregnancies, unspecified trimester: Secondary | ICD-10-CM

## 2019-10-20 DIAGNOSIS — Z8751 Personal history of pre-term labor: Secondary | ICD-10-CM | POA: Diagnosis not present

## 2019-10-20 NOTE — Progress Notes (Signed)
OB [redacted]w[redacted]d presents for 17P injection.  Given in RA, tolerated well.  Next 17P in 1 week.   Administrations This Visit    HYDROXYprogesterone caproate (Makena) autoinjector 275 mg    Admin Date 10/20/2019 Action Given Dose 275 mg Route Subcutaneous Administered By Maretta Bees, RMA

## 2019-10-20 NOTE — Progress Notes (Signed)
Patient was assessed and managed by nursing staff during this encounter. I have reviewed the chart and agree with the documentation and plan. I have also made any necessary editorial changes.  Catalina Antigua, MD 10/20/2019 1:03 PM

## 2019-10-24 ENCOUNTER — Other Ambulatory Visit: Payer: Self-pay

## 2019-10-24 ENCOUNTER — Ambulatory Visit: Payer: Medicaid Other | Attending: Obstetrics

## 2019-10-24 ENCOUNTER — Ambulatory Visit: Payer: Medicaid Other | Admitting: *Deleted

## 2019-10-24 ENCOUNTER — Encounter: Payer: Self-pay | Admitting: *Deleted

## 2019-10-24 DIAGNOSIS — O0992 Supervision of high risk pregnancy, unspecified, second trimester: Secondary | ICD-10-CM

## 2019-10-24 DIAGNOSIS — O09292 Supervision of pregnancy with other poor reproductive or obstetric history, second trimester: Secondary | ICD-10-CM

## 2019-10-24 DIAGNOSIS — O09212 Supervision of pregnancy with history of pre-term labor, second trimester: Secondary | ICD-10-CM | POA: Diagnosis not present

## 2019-10-24 DIAGNOSIS — Z362 Encounter for other antenatal screening follow-up: Secondary | ICD-10-CM | POA: Diagnosis not present

## 2019-10-24 DIAGNOSIS — O4592 Premature separation of placenta, unspecified, second trimester: Secondary | ICD-10-CM | POA: Diagnosis not present

## 2019-10-24 DIAGNOSIS — Z3A26 26 weeks gestation of pregnancy: Secondary | ICD-10-CM

## 2019-10-27 ENCOUNTER — Ambulatory Visit (INDEPENDENT_AMBULATORY_CARE_PROVIDER_SITE_OTHER): Payer: Medicaid Other

## 2019-10-27 ENCOUNTER — Other Ambulatory Visit: Payer: Self-pay

## 2019-10-27 DIAGNOSIS — Z8751 Personal history of pre-term labor: Secondary | ICD-10-CM

## 2019-10-27 DIAGNOSIS — O09899 Supervision of other high risk pregnancies, unspecified trimester: Secondary | ICD-10-CM

## 2019-10-27 NOTE — Progress Notes (Signed)
Pt is in the office for 17-p injection, administered in L arm and pt tolerated well .Marland Kitchen Administrations This Visit    HYDROXYprogesterone caproate (Makena) autoinjector 275 mg    Admin Date 10/27/2019 Action Given Dose 275 mg Route Subcutaneous Administered By Katrina Stack, RN

## 2019-11-03 ENCOUNTER — Other Ambulatory Visit: Payer: Medicaid Other

## 2019-11-03 ENCOUNTER — Other Ambulatory Visit: Payer: Self-pay

## 2019-11-03 ENCOUNTER — Ambulatory Visit (INDEPENDENT_AMBULATORY_CARE_PROVIDER_SITE_OTHER): Payer: Medicaid Other | Admitting: Obstetrics and Gynecology

## 2019-11-03 ENCOUNTER — Encounter: Payer: Self-pay | Admitting: Obstetrics and Gynecology

## 2019-11-03 VITALS — BP 118/80 | HR 81 | Wt 186.1 lb

## 2019-11-03 DIAGNOSIS — Z3490 Encounter for supervision of normal pregnancy, unspecified, unspecified trimester: Secondary | ICD-10-CM

## 2019-11-03 DIAGNOSIS — O09899 Supervision of other high risk pregnancies, unspecified trimester: Secondary | ICD-10-CM

## 2019-11-03 DIAGNOSIS — Z8751 Personal history of pre-term labor: Secondary | ICD-10-CM | POA: Diagnosis not present

## 2019-11-03 DIAGNOSIS — O0992 Supervision of high risk pregnancy, unspecified, second trimester: Secondary | ICD-10-CM

## 2019-11-03 NOTE — Patient Instructions (Signed)
Considering Waterbirth? °Guide for patients at [facility] °Why consider waterbirth? °• Gentle birth for babies  °• Less pain medicine used in labor  °• May allow for passive descent/less pushing  °• May reduce perineal tears  °• More mobility and instinctive maternal position changes  °• Increased maternal relaxation  °• Reduced blood pressure in labor  ° °Is waterbirth safe? What are the risks of infection, drowning or other complications? °• Infection:  °• Very low risk (3.7 % for tub vs 4.8% for bed)  °• 7 in 8000 waterbirths with documented infection  °• Poorly cleaned equipment most common cause  °• Slightly lower group B strep transmission rate  °• Drowning  °• Maternal:  °• Very low risk  °• Related to seizures or fainting  °• Newborn:  °• Very low risk. No evidence of increased risk of respiratory problems in multiple large studies  °• Physiological protection from breathing under water  °• Avoid underwater birth if there are any fetal complications  °• Once baby's head is out of the water, keep it out.  °• Birth complication  °• Some reports of cord trauma, but risk decreased by bringing baby to surface gradually  °• No evidence of increased risk of shoulder dystocia. Mothers can usually change positions faster in water than in a bed, possibly aiding the maneuvers to free the shoulder.  °? °You must attend a Waterbirth class at [facility] [place] °• 3rd Wednesday of every month from 7-9pm  °• Free  °• Register by calling [phone] or online at [email or web address]  °• Bring us the certificate from the class to your prenatal appointment  °Meet with a midwife at 36 weeks to see if you can still plan a waterbirth and to sign the consent.  ° °If you plan a waterbirth at [facility] [place], you can opt to purchase the following: °• Fish Net °• Bathing suit top (optional)  °• Long-handled mirror (optional)  °•  °Things that would  prevent you from having a waterbirth: °• Unknown or Positive COVID-19 diagnosis upon admission to hospital  °• Premature, <37wks  °• Previous cesarean birth  °• Presence of thick meconium-stained fluid  °• Multiple gestation (Twins, triplets, etc.)  °• Uncontrolled diabetes or gestational diabetes requiring medication  °• Hypertension requiring medication or diagnosis of pre-eclampsia  °• Heavy vaginal bleeding  °• Non-reassuring fetal heart rate  °• Active infection (MRSA, etc.). Group B Strep is NOT a contraindication for waterbirth.  °• If your labor has to be induced and induction method requires continuous monitoring of the baby's heart rate  °• Other risks/issues identified by your obstetrical provider  °Please remember that birth is unpredictable. Under certain unforeseeable circumstances your provider may advise against giving birth in the tub. These decisions will be made on a case-by-case basis and with the safety of you and your baby as our highest priority. ° °**Please remember that in order to have a waterbirth, you must test Negative to COVID-19 upon admission to the hospital.** ° °

## 2019-11-03 NOTE — Progress Notes (Signed)
   PRENATAL VISIT NOTE  Subjective:  Briana Wagner is a 29 y.o. [phone] at [redacted]w[redacted]d being seen today for ongoing prenatal care.  She is currently monitored for the following issues for this high-risk pregnancy and has Vaginal bleeding in pregnancy; Supervision of high-risk pregnancy; History of preterm delivery, currently pregnant; and History of placental abruption on their problem list.  Patient reports no complaints.  Contractions: Not present. Vag. Bleeding: None.  Movement: Present. Denies leaking of fluid.   The following portions of the patient's history were reviewed and updated as appropriate: allergies, current medications, past family history, past medical history, past social history, past surgical history and problem list.   Objective:   Vitals:   11/03/19 0937  BP: 118/80  Pulse: 81  Weight: 186 lb 1.6 oz (84.4 kg)    Fetal Status:     Movement: Present     General:  Alert, oriented and cooperative. Patient is in no acute distress.  Skin: Skin is warm and dry. No rash noted.   Cardiovascular: Normal heart rate noted  Respiratory: Normal respiratory effort, no problems with respiration noted  Abdomen: Soft, gravid, appropriate for gestational age.  Pain/Pressure: Absent     Pelvic: Cervical exam deferred        Extremities: Normal range of motion.  Edema: Trace  Mental Status: Normal mood and affect. Normal behavior. Normal judgment and thought content.   Assessment and Plan:  Pregnancy: [id number] at [redacted]w[redacted]d 1. Supervision of high risk pregnancy in second trimester Patient is doing well without complaints Third trimester labs today Patient interested in waterbirth  Patient undecided on contraception - Glucose Tolerance, 2 Hours w/1 Hour - CBC - RPR - HIV Antibody (routine testing w rflx)  2. History of preterm delivery, currently pregnant Continue weekly 17-P  Preterm labor symptoms and general obstetric precautions including but not limited to vaginal bleeding,  contractions, leaking of fluid and fetal movement were reviewed in detail with the patient. Please refer to After Visit Summary for other counseling recommendations.   Return in about 2 weeks (around 11/17/2019) for in person, ROB, High risk.  Future Appointments  Date Time Provider Department Center  11/03/2019  9:45 AM Mattheo Swindle, Gigi Gin, MD [facility] None    Catalina Antigua, MD

## 2019-11-03 NOTE — Progress Notes (Signed)
Pt is here for ROB and 2 Hr GTT, [redacted]w[redacted]d.

## 2019-11-04 LAB — CBC
Hematocrit: 41 % (ref 34.0–46.6)
Hemoglobin: 14.1 g/dL (ref 11.1–15.9)
MCH: 32.9 pg (ref 26.6–33.0)
MCHC: 34.4 g/dL (ref 31.5–35.7)
MCV: 96 fL (ref 79–97)
Platelets: 199 10*3/uL (ref 150–450)
RBC: 4.29 x10E6/uL (ref 3.77–5.28)
RDW: 12.9 % (ref 11.7–15.4)
WBC: 9.2 10*3/uL (ref 3.4–10.8)

## 2019-11-04 LAB — GLUCOSE TOLERANCE, 2 HOURS W/ 1HR
Glucose, 1 hour: 133 mg/dL (ref 65–179)
Glucose, 2 hour: 105 mg/dL (ref 65–152)
Glucose, Fasting: 83 mg/dL (ref 65–91)

## 2019-11-04 LAB — RPR: RPR Ser Ql: NONREACTIVE

## 2019-11-04 LAB — HIV ANTIBODY (ROUTINE TESTING W REFLEX): HIV Screen 4th Generation wRfx: NONREACTIVE

## 2019-11-17 ENCOUNTER — Ambulatory Visit (INDEPENDENT_AMBULATORY_CARE_PROVIDER_SITE_OTHER): Payer: Medicaid Other | Admitting: Obstetrics and Gynecology

## 2019-11-17 ENCOUNTER — Other Ambulatory Visit: Payer: Self-pay

## 2019-11-17 ENCOUNTER — Encounter: Payer: Self-pay | Admitting: Obstetrics and Gynecology

## 2019-11-17 VITALS — BP 121/78 | HR 73 | Wt 192.0 lb

## 2019-11-17 DIAGNOSIS — Z8751 Personal history of pre-term labor: Secondary | ICD-10-CM | POA: Diagnosis not present

## 2019-11-17 DIAGNOSIS — Z3A30 30 weeks gestation of pregnancy: Secondary | ICD-10-CM | POA: Diagnosis not present

## 2019-11-17 DIAGNOSIS — O0993 Supervision of high risk pregnancy, unspecified, third trimester: Secondary | ICD-10-CM

## 2019-11-17 DIAGNOSIS — O09899 Supervision of other high risk pregnancies, unspecified trimester: Secondary | ICD-10-CM

## 2019-11-17 NOTE — Patient Instructions (Signed)
Considering Waterbirth? °Guide for patients at [facility] °Why consider waterbirth? °• Gentle birth for babies  °• Less pain medicine used in labor  °• May allow for passive descent/less pushing  °• May reduce perineal tears  °• More mobility and instinctive maternal position changes  °• Increased maternal relaxation  °• Reduced blood pressure in labor  ° °Is waterbirth safe? What are the risks of infection, drowning or other complications? °• Infection:  °• Very low risk (3.7 % for tub vs 4.8% for bed)  °• 7 in 8000 waterbirths with documented infection  °• Poorly cleaned equipment most common cause  °• Slightly lower group B strep transmission rate  °• Drowning  °• Maternal:  °• Very low risk  °• Related to seizures or fainting  °• Newborn:  °• Very low risk. No evidence of increased risk of respiratory problems in multiple large studies  °• Physiological protection from breathing under water  °• Avoid underwater birth if there are any fetal complications  °• Once baby's head is out of the water, keep it out.  °• Birth complication  °• Some reports of cord trauma, but risk decreased by bringing baby to surface gradually  °• No evidence of increased risk of shoulder dystocia. Mothers can usually change positions faster in water than in a bed, possibly aiding the maneuvers to free the shoulder.  °? °You must attend a Waterbirth class at [facility] [place] °• 3rd Wednesday of every month from 7-9pm  °• Free  °• Register by calling [phone] or online at [email or web address]  °• Bring us the certificate from the class to your prenatal appointment  °Meet with a midwife at 36 weeks to see if you can still plan a waterbirth and to sign the consent.  ° °If you plan a waterbirth at [facility] [place], you can opt to purchase the following: °• Fish Net °• Bathing suit top (optional)  °• Long-handled mirror (optional)  °•  °Things that would  prevent you from having a waterbirth: °• Unknown or Positive COVID-19 diagnosis upon admission to hospital  °• Premature, <37wks  °• Previous cesarean birth  °• Presence of thick meconium-stained fluid  °• Multiple gestation (Twins, triplets, etc.)  °• Uncontrolled diabetes or gestational diabetes requiring medication  °• Hypertension requiring medication or diagnosis of pre-eclampsia  °• Heavy vaginal bleeding  °• Non-reassuring fetal heart rate  °• Active infection (MRSA, etc.). Group B Strep is NOT a contraindication for waterbirth.  °• If your labor has to be induced and induction method requires continuous monitoring of the baby's heart rate  °• Other risks/issues identified by your obstetrical provider  °Please remember that birth is unpredictable. Under certain unforeseeable circumstances your provider may advise against giving birth in the tub. These decisions will be made on a case-by-case basis and with the safety of you and your baby as our highest priority. ° °**Please remember that in order to have a waterbirth, you must test Negative to COVID-19 upon admission to the hospital.** ° °

## 2019-11-17 NOTE — Progress Notes (Signed)
Pt presents for ROB reports no complaints today.  

## 2019-11-17 NOTE — Progress Notes (Signed)
   PRENATAL VISIT NOTE  Subjective:  Briana Wagner is a 29 y.o. [phone] at [redacted]w[redacted]d being seen today for ongoing prenatal care.  She is currently monitored for the following issues for this high-risk pregnancy and has Vaginal bleeding in pregnancy; Supervision of high-risk pregnancy; History of preterm delivery, currently pregnant; and History of placental abruption on their problem list.  Patient reports no complaints.  Contractions: Not present. Vag. Bleeding: None.  Movement: Present. Denies leaking of fluid.   The following portions of the patient's history were reviewed and updated as appropriate: allergies, current medications, past family history, past medical history, past social history, past surgical history and problem list.   Objective:   Vitals:   11/17/19 1008  BP: 121/78  Pulse: 73  Weight: 192 lb (87.1 kg)    Fetal Status: Fetal Heart Rate (bpm): 140 Fundal Height: 30 cm Movement: Present     General:  Alert, oriented and cooperative. Patient is in no acute distress.  Skin: Skin is warm and dry. No rash noted.   Cardiovascular: Normal heart rate noted  Respiratory: Normal respiratory effort, no problems with respiration noted  Abdomen: Soft, gravid, appropriate for gestational age.  Pain/Pressure: Absent     Pelvic: Cervical exam deferred        Extremities: Normal range of motion.  Edema: None  Mental Status: Normal mood and affect. Normal behavior. Normal judgment and thought content.   Assessment and Plan:  Pregnancy: [id number] at [redacted]w[redacted]d 1. Supervision of high risk pregnancy in third trimester Patient is doing well without complaints Patient plans birth control pills for contraception  2. [redacted] weeks gestation of pregnancy   3. History of preterm delivery, currently pregnant Continue weekly 17-P  Preterm labor symptoms and general obstetric precautions including but not limited to vaginal bleeding, contractions, leaking of fluid and fetal movement were reviewed  in detail with the patient. Please refer to After Visit Summary for other counseling recommendations.   Return in about 2 weeks (around 12/01/2019) for in person, ROB, High risk, weekly for 17-P.  No future appointments.  Catalina Antigua, MD

## 2019-11-24 ENCOUNTER — Ambulatory Visit (INDEPENDENT_AMBULATORY_CARE_PROVIDER_SITE_OTHER): Payer: Medicaid Other

## 2019-11-24 ENCOUNTER — Other Ambulatory Visit: Payer: Self-pay

## 2019-11-24 VITALS — BP 129/87 | HR 86 | Wt 193.0 lb

## 2019-11-24 DIAGNOSIS — Z8751 Personal history of pre-term labor: Secondary | ICD-10-CM

## 2019-11-24 DIAGNOSIS — O09899 Supervision of other high risk pregnancies, unspecified trimester: Secondary | ICD-10-CM

## 2019-11-24 NOTE — Progress Notes (Signed)
OB presents for 17P injection, given in RA, tolerated well.  Next 17P in 1 week.  Administrations This Visit    HYDROXYprogesterone caproate (Makena) autoinjector 275 mg    Admin Date 11/24/2019 Action Given Dose 275 mg Route Subcutaneous Administered By Maretta Bees, RMA

## 2019-12-01 ENCOUNTER — Encounter: Payer: Self-pay | Admitting: Certified Nurse Midwife

## 2019-12-01 ENCOUNTER — Other Ambulatory Visit: Payer: Self-pay

## 2019-12-01 ENCOUNTER — Ambulatory Visit (INDEPENDENT_AMBULATORY_CARE_PROVIDER_SITE_OTHER): Payer: Medicaid Other | Admitting: Certified Nurse Midwife

## 2019-12-01 VITALS — BP 137/81 | HR 108 | Wt 192.0 lb

## 2019-12-01 DIAGNOSIS — N898 Other specified noninflammatory disorders of vagina: Secondary | ICD-10-CM

## 2019-12-01 DIAGNOSIS — O0993 Supervision of high risk pregnancy, unspecified, third trimester: Secondary | ICD-10-CM

## 2019-12-01 DIAGNOSIS — Z3A32 32 weeks gestation of pregnancy: Secondary | ICD-10-CM

## 2019-12-01 DIAGNOSIS — Z8751 Personal history of pre-term labor: Secondary | ICD-10-CM

## 2019-12-01 DIAGNOSIS — O09899 Supervision of other high risk pregnancies, unspecified trimester: Secondary | ICD-10-CM

## 2019-12-01 MED ORDER — TERCONAZOLE 0.4 % VA CREA: 1.0000 | TOPICAL_CREAM | Freq: Every day | VAGINAL | 0 refills | Status: DC

## 2019-12-01 NOTE — Progress Notes (Signed)
   PRENATAL VISIT NOTE  Subjective:  Briana Wagner is a 29 y.o. [phone] at [redacted]w[redacted]d being seen today for ongoing prenatal care.  She is currently monitored for the following issues for this high-risk pregnancy and has Vaginal bleeding in pregnancy; Supervision of high-risk pregnancy; History of preterm delivery, currently pregnant; and History of placental abruption on their problem list.  Patient reports vaginal irritation.  Contractions: Not present. Vag. Bleeding: None.  Movement: Present. Denies leaking of fluid.   The following portions of the patient's history were reviewed and updated as appropriate: allergies, current medications, past family history, past medical history, past social history, past surgical history and problem list.   Objective:   Vitals:   12/01/19 1453  BP: 137/81  Pulse: (!) 108  Weight: 192 lb (87.1 kg)    Fetal Status: Fetal Heart Rate (bpm): 130 Fundal Height: 33 cm Movement: Present     General:  Alert, oriented and cooperative. Patient is in no acute distress.  Skin: Skin is warm and dry. No rash noted.   Cardiovascular: Normal heart rate noted  Respiratory: Normal respiratory effort, no problems with respiration noted  Abdomen: Soft, gravid, appropriate for gestational age.  Pain/Pressure: Absent     Pelvic: Cervical exam deferred        Extremities: Normal range of motion.  Edema: None  Mental Status: Normal mood and affect. Normal behavior. Normal judgment and thought content.   Assessment and Plan:  Pregnancy: [id number] at [redacted]w[redacted]d 1. Supervision of high risk pregnancy in third trimester - Patient doing well, high risk due to hx of PTD otherwise low risk  - Anticipatory guidance on upcoming appointments   2. History of preterm delivery, currently pregnant - continue 17P injections until 36 weeks   3. [redacted] weeks gestation of pregnancy  4. Vaginal irritation - patient reports vaginal irritation and discharge over the past week - described the  discharge as white chalky discharge  - terconazole (TERAZOL 7) 0.4 % vaginal cream; Place 1 applicator vaginally at bedtime. Use for seven days  Dispense: 45 g; Refill: 0  Preterm labor symptoms and general obstetric precautions including but not limited to vaginal bleeding, contractions, leaking of fluid and fetal movement were reviewed in detail with the patient. Please refer to After Visit Summary for other counseling recommendations.   Return in about 2 weeks (around 12/15/2019) for HROB/17P- in office .  Future Appointments  Date Time Provider Department Center  12/08/2019 10:00 AM [facility] NURSE [facility] None  12/14/2019  9:00 AM Warden Fillers, MD [facility] None    Sharyon Cable, CNM

## 2019-12-01 NOTE — Patient Instructions (Signed)

## 2019-12-01 NOTE — Progress Notes (Signed)
Pt is doing well today, no complaints.

## 2019-12-08 ENCOUNTER — Ambulatory Visit: Payer: Medicaid Other

## 2019-12-14 ENCOUNTER — Other Ambulatory Visit: Payer: Self-pay

## 2019-12-14 ENCOUNTER — Ambulatory Visit (INDEPENDENT_AMBULATORY_CARE_PROVIDER_SITE_OTHER): Payer: Medicaid Other | Admitting: Obstetrics and Gynecology

## 2019-12-14 VITALS — BP 114/81 | HR 94 | Wt 197.0 lb

## 2019-12-14 DIAGNOSIS — Z8759 Personal history of other complications of pregnancy, childbirth and the puerperium: Secondary | ICD-10-CM

## 2019-12-14 DIAGNOSIS — O09899 Supervision of other high risk pregnancies, unspecified trimester: Secondary | ICD-10-CM

## 2019-12-14 DIAGNOSIS — Z3A34 34 weeks gestation of pregnancy: Secondary | ICD-10-CM | POA: Insufficient documentation

## 2019-12-14 DIAGNOSIS — O0993 Supervision of high risk pregnancy, unspecified, third trimester: Secondary | ICD-10-CM

## 2019-12-14 NOTE — Progress Notes (Addendum)
ROB, reports no problems today, urine +Glucose 1000.  Patient refused 17P injection.

## 2019-12-14 NOTE — Patient Instructions (Signed)

## 2019-12-14 NOTE — Progress Notes (Signed)
   PRENATAL VISIT NOTE  Subjective:  Briana Wagner is a 29 y.o. [phone] at [redacted]w[redacted]d being seen today for ongoing prenatal care.  She is currently monitored for the following issues for this high-risk pregnancy and has Vaginal bleeding in pregnancy; Supervision of high-risk pregnancy; History of preterm delivery, currently pregnant; History of placental abruption; and [redacted] weeks gestation of pregnancy on their problem list.  Patient doing well with no acute concerns today. She reports no complaints.  Contractions: Not present. Vag. Bleeding: None.  Movement: Present. Denies leaking of fluid.   The following portions of the patient's history were reviewed and updated as appropriate: allergies, current medications, past family history, past medical history, past social history, past surgical history and problem list. Problem list updated.  Objective:   Vitals:   12/14/19 0904  BP: 114/81  Pulse: 94  Weight: 197 lb (89.4 kg)    Fetal Status: Fetal Heart Rate (bpm): 154 Fundal Height: 34 cm Movement: Present     General:  Alert, oriented and cooperative. Patient is in no acute distress.  Skin: Skin is warm and dry. No rash noted.   Cardiovascular: Normal heart rate noted  Respiratory: Normal respiratory effort, no problems with respiration noted  Abdomen: Soft, gravid, appropriate for gestational age.  Pain/Pressure: Absent     Pelvic: Cervical exam deferred        Extremities: Normal range of motion.  Edema: Trace  Mental Status:  Normal mood and affect. Normal behavior. Normal judgment and thought content.   Assessment and Plan:  Pregnancy: [id number] at [redacted]w[redacted]d  1. Supervision of high risk pregnancy in third trimester Pt doing well, she declined her makena injection today, she wants things to "run their course." Discussed birth control options.  POP, depo provera, IUD and nexplanon discussed, pt unsure about her choice Per pt she has completed her waterbirth course  2. History of preterm  delivery, currently pregnant No s/sx of PTL  3. History of placental abruption Stable currently  4. [redacted] weeks gestation of pregnancy   Preterm labor symptoms and general obstetric precautions including but not limited to vaginal bleeding, contractions, leaking of fluid and fetal movement were reviewed in detail with the patient.  Please refer to After Visit Summary for other counseling recommendations.   Return in about 2 weeks (around 12/28/2019) for [facility], in person, 36 weeks swabs.   Mariel Aloe, MD

## 2019-12-14 NOTE — Progress Notes (Signed)
Patient ID: Briana Wagner, female   DOB: 15-May-1990, 29 y.o.   MRN: [id number] Patient was assessed and managed by nursing staff during this encounter. I have reviewed the chart and agree with the documentation and plan. I have also made any necessary editorial changes.  Adam Phenix, MD

## 2019-12-28 ENCOUNTER — Other Ambulatory Visit: Payer: Self-pay

## 2019-12-28 ENCOUNTER — Other Ambulatory Visit (HOSPITAL_COMMUNITY)
Admission: RE | Admit: 2019-12-28 | Discharge: 2019-12-28 | Disposition: A | Discharge status: Home / Self Care | Payer: Medicaid Other | Source: Ambulatory Visit | Attending: Obstetrics and Gynecology | Admitting: Obstetrics and Gynecology

## 2019-12-28 ENCOUNTER — Encounter: Payer: Self-pay | Admitting: Obstetrics and Gynecology

## 2019-12-28 ENCOUNTER — Ambulatory Visit (INDEPENDENT_AMBULATORY_CARE_PROVIDER_SITE_OTHER): Payer: Medicaid Other | Admitting: Obstetrics and Gynecology

## 2019-12-28 VITALS — BP 127/80 | HR 79 | Wt 196.4 lb

## 2019-12-28 DIAGNOSIS — O0993 Supervision of high risk pregnancy, unspecified, third trimester: Secondary | ICD-10-CM | POA: Diagnosis present

## 2019-12-28 DIAGNOSIS — Z8759 Personal history of other complications of pregnancy, childbirth and the puerperium: Secondary | ICD-10-CM

## 2019-12-28 DIAGNOSIS — O09899 Supervision of other high risk pregnancies, unspecified trimester: Secondary | ICD-10-CM

## 2019-12-28 NOTE — Patient Instructions (Signed)
° ° ° °  If you have lab work done today you will be contacted with your lab results within the next 2 weeks.  If you have not heard from us then please contact us. The fastest way to get your results is to register for My Chart. ° ° °IF you received an x-ray today, you will receive an invoice from [facility]. Please contact [facility] at [phone] with questions or concerns regarding your invoice.  ° °IF you received labwork today, you will receive an invoice from [facility]. Please contact [facility] at [phone] with questions or concerns regarding your invoice.  ° °Our billing staff will not be able to assist you with questions regarding bills from these companies. ° °You will be contacted with the lab results as soon as they are available. The fastest way to get your results is to activate your My Chart account. Instructions are located on the last page of this paperwork. If you have not heard from us regarding the results in 2 weeks, please contact this office. °  ° ° ° °

## 2019-12-28 NOTE — Progress Notes (Signed)
Subjective:  Briana Wagner is a 29 y.o. [phone] at [redacted]w[redacted]d being seen today for ongoing prenatal care.  She is currently monitored for the following issues for this high-risk pregnancy and has Vaginal bleeding in pregnancy; Supervision of high-risk pregnancy; History of preterm delivery, currently pregnant; History of placental abruption; and [redacted] weeks gestation of pregnancy on their problem list.  Patient reports general discomforts of pregnancy.  Contractions: Not present. Vag. Bleeding: None.  Movement: Present. Denies leaking of fluid.   The following portions of the patient's history were reviewed and updated as appropriate: allergies, current medications, past family history, past medical history, past social history, past surgical history and problem list. Problem list updated.  Objective:   Vitals:   12/28/19 0920  BP: 127/80  Pulse: 79  Weight: 196 lb 6.4 oz (89.1 kg)    Fetal Status: Fetal Heart Rate (bpm): 130   Movement: Present     General:  Alert, oriented and cooperative. Patient is in no acute distress.  Skin: Skin is warm and dry. No rash noted.   Cardiovascular: Normal heart rate noted  Respiratory: Normal respiratory effort, no problems with respiration noted  Abdomen: Soft, gravid, appropriate for gestational age. Pain/Pressure: Absent     Pelvic:  Cervical exam performed        Extremities: Normal range of motion.  Edema: Trace  Mental Status: Normal mood and affect. Normal behavior. Normal judgment and thought content.   Urinalysis:      Assessment and Plan:  Pregnancy: [id number] at [redacted]w[redacted]d  1. Supervision of high risk pregnancy in third trimester Stable Labor precautions - POCT urinalysis dipstick - Strep Gp B NAA - Cervicovaginal ancillary only  2. History of preterm delivery, currently pregnant   3. History of placental abruption   Preterm labor symptoms and general obstetric precautions including but not limited to vaginal bleeding, contractions,  leaking of fluid and fetal movement were reviewed in detail with the patient. Please refer to After Visit Summary for other counseling recommendations.  Return in about 1 week (around 01/04/2020) for OB visit, face to face, any provider.   Hermina Staggers, MD

## 2019-12-29 LAB — CERVICOVAGINAL ANCILLARY ONLY
Chlamydia: NEGATIVE
Comment: NEGATIVE
Comment: NORMAL
Neisseria Gonorrhea: NEGATIVE

## 2019-12-30 LAB — STREP GP B NAA: Strep Gp B NAA: POSITIVE — AB

## 2020-01-02 ENCOUNTER — Encounter: Payer: Self-pay | Admitting: Obstetrics and Gynecology

## 2020-01-02 DIAGNOSIS — O99820 Streptococcus B carrier state complicating pregnancy: Secondary | ICD-10-CM | POA: Insufficient documentation

## 2020-01-03 ENCOUNTER — Other Ambulatory Visit: Payer: Self-pay

## 2020-01-03 ENCOUNTER — Encounter: Payer: Self-pay | Admitting: Obstetrics and Gynecology

## 2020-01-03 ENCOUNTER — Ambulatory Visit (INDEPENDENT_AMBULATORY_CARE_PROVIDER_SITE_OTHER): Payer: Medicaid Other | Admitting: Obstetrics and Gynecology

## 2020-01-03 VITALS — BP 118/83 | HR 102 | Wt 200.3 lb

## 2020-01-03 DIAGNOSIS — Z3A37 37 weeks gestation of pregnancy: Secondary | ICD-10-CM

## 2020-01-03 DIAGNOSIS — Z8759 Personal history of other complications of pregnancy, childbirth and the puerperium: Secondary | ICD-10-CM

## 2020-01-03 DIAGNOSIS — O09899 Supervision of other high risk pregnancies, unspecified trimester: Secondary | ICD-10-CM

## 2020-01-03 DIAGNOSIS — O0993 Supervision of high risk pregnancy, unspecified, third trimester: Secondary | ICD-10-CM

## 2020-01-03 NOTE — Progress Notes (Signed)
   PRENATAL VISIT NOTE  Subjective:  Briana Wagner is a 29 y.o. [phone] at [redacted]w[redacted]d being seen today for ongoing prenatal care.  She is currently monitored for the following issues for this high-risk pregnancy and has Vaginal bleeding in pregnancy; Supervision of high-risk pregnancy; History of preterm delivery, currently pregnant; History of placental abruption; [redacted] weeks gestation of pregnancy; and GBS (group B Streptococcus carrier), +RV culture, currently pregnant on their problem list.  Patient reports no complaints.  Contractions: Not present. Vag. Bleeding: None.  Movement: Present. Denies leaking of fluid.   The following portions of the patient's history were reviewed and updated as appropriate: allergies, current medications, past family history, past medical history, past social history, past surgical history and problem list.   Objective:   Vitals:   01/03/20 1056  BP: 118/83  Pulse: (!) 102  Weight: 200 lb 4.8 oz (90.9 kg)    Fetal Status: Fetal Heart Rate (bpm): 142 Fundal Height: 37 cm Movement: Present     General:  Alert, oriented and cooperative. Patient is in no acute distress.  Skin: Skin is warm and dry. No rash noted.   Cardiovascular: Normal heart rate noted  Respiratory: Normal respiratory effort, no problems with respiration noted  Abdomen: Soft, gravid, appropriate for gestational age.  Pain/Pressure: Present     Pelvic: Cervical exam deferred        Extremities: Normal range of motion.  Edema: Trace  Mental Status: Normal mood and affect. Normal behavior. Normal judgment and thought content.   Assessment and Plan:  Pregnancy: [id number] at [redacted]w[redacted]d 1. [redacted] weeks gestation of pregnancy   2. Supervision of high risk pregnancy in third trimester   3. History of preterm delivery, currently pregnant   4. History of placental abruption - Kick counts discussed. - Abdominal pain precautions given.    Term labor symptoms and general obstetric precautions  including but not limited to vaginal bleeding, contractions, leaking of fluid and fetal movement were reviewed in detail with the patient. Please refer to After Visit Summary for other counseling recommendations.   Return in about 1 week (around 01/10/2020) for HROB.  Future Appointments  Date Time Provider Department Center  01/10/2020  8:15 AM Johnny Bridge, MD [facility] None  01/17/2020  9:15 AM Adam Phenix, MD [facility] None    Johnny Bridge, MD

## 2020-01-10 ENCOUNTER — Encounter: Payer: Self-pay | Admitting: Obstetrics and Gynecology

## 2020-01-10 ENCOUNTER — Other Ambulatory Visit: Payer: Self-pay

## 2020-01-10 ENCOUNTER — Ambulatory Visit (INDEPENDENT_AMBULATORY_CARE_PROVIDER_SITE_OTHER): Payer: Medicaid Other | Admitting: Obstetrics and Gynecology

## 2020-01-10 VITALS — BP 116/80 | HR 91 | Wt 204.0 lb

## 2020-01-10 DIAGNOSIS — Z8759 Personal history of other complications of pregnancy, childbirth and the puerperium: Secondary | ICD-10-CM

## 2020-01-10 DIAGNOSIS — O09899 Supervision of other high risk pregnancies, unspecified trimester: Secondary | ICD-10-CM

## 2020-01-10 DIAGNOSIS — O0993 Supervision of high risk pregnancy, unspecified, third trimester: Secondary | ICD-10-CM

## 2020-01-10 NOTE — Progress Notes (Signed)
   PRENATAL VISIT NOTE  Subjective:  Briana Wagner is a 29 y.o. [phone] at [redacted]w[redacted]d being seen today for ongoing prenatal care.  She is currently monitored for the following issues for this high-risk pregnancy and has Supervision of high-risk pregnancy; History of preterm delivery, currently pregnant; History of placental abruption; and GBS (group B Streptococcus carrier), +RV culture, currently pregnant on their problem list.  Patient reports no complaints.  Contractions: Not present. Vag. Bleeding: None.  Movement: Present. Denies leaking of fluid.   The following portions of the patient's history were reviewed and updated as appropriate: allergies, current medications, past family history, past medical history, past social history, past surgical history and problem list.   Objective:   Vitals:   01/10/20 0820  BP: 116/80  Pulse: 91  Weight: 204 lb (92.5 kg)    Fetal Status: Fetal Heart Rate (bpm): 150 Fundal Height: 36 cm Movement: Present     General:  Alert, oriented and cooperative. Patient is in no acute distress.  Skin: Skin is warm and dry. No rash noted.   Cardiovascular: Normal heart rate noted  Respiratory: Normal respiratory effort, no problems with respiration noted  Abdomen: Soft, gravid, appropriate for gestational age.  Pain/Pressure: Absent     Pelvic: Cervical exam deferred        Extremities: Normal range of motion.  Edema: Trace  Mental Status: Normal mood and affect. Normal behavior. Normal judgment and thought content.   Assessment and Plan:  Pregnancy: [id number] at [redacted]w[redacted]d 1. Supervision of high risk pregnancy in third trimester  - [place] MFM OB COMP + 14 WK; Future  2. History of preterm delivery, currently pregnant   3. History of placental abruption - Bleeding and pain precaution given.  4. Small for gestational age - Will obtain OB US for growth this week.    Term labor symptoms and general obstetric precautions including but not limited to vaginal  bleeding, contractions, leaking of fluid and fetal movement were reviewed in detail with the patient. Please refer to After Visit Summary for other counseling recommendations.   Return in about 1 week (around 01/17/2020) for HROB.  .  Future Appointments  Date Time Provider Department Center  01/17/2020  9:15 AM Adam Phenix, MD [facility] None    Johnny Bridge, MD

## 2020-01-13 ENCOUNTER — Other Ambulatory Visit: Payer: Self-pay

## 2020-01-13 ENCOUNTER — Ambulatory Visit: Payer: Medicaid Other | Attending: Obstetrics and Gynecology

## 2020-01-13 ENCOUNTER — Encounter: Payer: Self-pay | Admitting: *Deleted

## 2020-01-13 ENCOUNTER — Ambulatory Visit: Payer: Medicaid Other | Admitting: *Deleted

## 2020-01-13 DIAGNOSIS — Z362 Encounter for other antenatal screening follow-up: Secondary | ICD-10-CM

## 2020-01-13 DIAGNOSIS — Z3A38 38 weeks gestation of pregnancy: Secondary | ICD-10-CM | POA: Diagnosis not present

## 2020-01-13 DIAGNOSIS — O0993 Supervision of high risk pregnancy, unspecified, third trimester: Secondary | ICD-10-CM

## 2020-01-13 DIAGNOSIS — O09293 Supervision of pregnancy with other poor reproductive or obstetric history, third trimester: Secondary | ICD-10-CM | POA: Diagnosis not present

## 2020-01-13 DIAGNOSIS — O4593 Premature separation of placenta, unspecified, third trimester: Secondary | ICD-10-CM

## 2020-01-13 DIAGNOSIS — O09213 Supervision of pregnancy with history of pre-term labor, third trimester: Secondary | ICD-10-CM

## 2020-01-17 ENCOUNTER — Encounter: Payer: Self-pay | Admitting: Family Medicine

## 2020-01-17 ENCOUNTER — Inpatient Hospital Stay (HOSPITAL_COMMUNITY)
Admission: AD | Admit: 2020-01-17 | Discharge: 2020-01-20 | DRG: 807 | Disposition: A | Discharge status: Home / Self Care | Payer: Medicaid Other | Attending: Obstetrics and Gynecology | Admitting: Obstetrics and Gynecology

## 2020-01-17 ENCOUNTER — Ambulatory Visit (INDEPENDENT_AMBULATORY_CARE_PROVIDER_SITE_OTHER): Payer: Medicaid Other | Admitting: Obstetrics & Gynecology

## 2020-01-17 ENCOUNTER — Other Ambulatory Visit: Payer: Self-pay

## 2020-01-17 ENCOUNTER — Encounter (HOSPITAL_COMMUNITY): Payer: Self-pay | Admitting: Family Medicine

## 2020-01-17 VITALS — BP 120/87 | HR 88 | Wt 204.0 lb

## 2020-01-17 DIAGNOSIS — J45909 Unspecified asthma, uncomplicated: Secondary | ICD-10-CM | POA: Diagnosis present

## 2020-01-17 DIAGNOSIS — O9952 Diseases of the respiratory system complicating childbirth: Secondary | ICD-10-CM | POA: Diagnosis present

## 2020-01-17 DIAGNOSIS — O368130 Decreased fetal movements, third trimester, not applicable or unspecified: Secondary | ICD-10-CM | POA: Diagnosis present

## 2020-01-17 DIAGNOSIS — F17210 Nicotine dependence, cigarettes, uncomplicated: Secondary | ICD-10-CM | POA: Diagnosis present

## 2020-01-17 DIAGNOSIS — Z8759 Personal history of other complications of pregnancy, childbirth and the puerperium: Secondary | ICD-10-CM

## 2020-01-17 DIAGNOSIS — O365930 Maternal care for other known or suspected poor fetal growth, third trimester, not applicable or unspecified: Secondary | ICD-10-CM | POA: Diagnosis present

## 2020-01-17 DIAGNOSIS — Z3A39 39 weeks gestation of pregnancy: Secondary | ICD-10-CM | POA: Diagnosis not present

## 2020-01-17 DIAGNOSIS — O99214 Obesity complicating childbirth: Secondary | ICD-10-CM | POA: Diagnosis present

## 2020-01-17 DIAGNOSIS — Z20822 Contact with and (suspected) exposure to covid-19: Secondary | ICD-10-CM | POA: Diagnosis present

## 2020-01-17 DIAGNOSIS — Z30430 Encounter for insertion of intrauterine contraceptive device: Secondary | ICD-10-CM | POA: Diagnosis not present

## 2020-01-17 DIAGNOSIS — O0993 Supervision of high risk pregnancy, unspecified, third trimester: Secondary | ICD-10-CM

## 2020-01-17 DIAGNOSIS — O99334 Smoking (tobacco) complicating childbirth: Secondary | ICD-10-CM | POA: Diagnosis present

## 2020-01-17 DIAGNOSIS — O368190 Decreased fetal movements, unspecified trimester, not applicable or unspecified: Secondary | ICD-10-CM | POA: Diagnosis present

## 2020-01-17 DIAGNOSIS — E669 Obesity, unspecified: Secondary | ICD-10-CM | POA: Diagnosis present

## 2020-01-17 DIAGNOSIS — O99824 Streptococcus B carrier state complicating childbirth: Secondary | ICD-10-CM | POA: Diagnosis present

## 2020-01-17 DIAGNOSIS — O0990 Supervision of high risk pregnancy, unspecified, unspecified trimester: Secondary | ICD-10-CM

## 2020-01-17 DIAGNOSIS — O99820 Streptococcus B carrier state complicating pregnancy: Secondary | ICD-10-CM

## 2020-01-17 DIAGNOSIS — O09899 Supervision of other high risk pregnancies, unspecified trimester: Secondary | ICD-10-CM

## 2020-01-17 LAB — CBC
HCT: 42.9 % (ref 36.0–46.0)
Hemoglobin: 14.4 g/dL (ref 12.0–15.0)
MCH: 31.1 pg (ref 26.0–34.0)
MCHC: 33.6 g/dL (ref 30.0–36.0)
MCV: 92.7 fL (ref 80.0–100.0)
Platelets: 208 10*3/uL (ref 150–400)
RBC: 4.63 MIL/uL (ref 3.87–5.11)
RDW: 13.9 % (ref 11.5–15.5)
WBC: 8.8 10*3/uL (ref 4.0–10.5)
nRBC: 0 % (ref 0.0–0.2)

## 2020-01-17 LAB — RESPIRATORY PANEL BY RT PCR (FLU A&B, COVID)
Influenza A by PCR: NEGATIVE
Influenza B by PCR: NEGATIVE
SARS Coronavirus 2 by RT PCR: NEGATIVE

## 2020-01-17 LAB — TYPE AND SCREEN
ABO/RH(D): O POS
Antibody Screen: NEGATIVE

## 2020-01-17 MED ORDER — LEVONORGESTREL 19.5 MCG/DAY IU IUD
INTRAUTERINE_SYSTEM | Freq: Once | INTRAUTERINE | Status: DC
  Filled 2020-01-17: qty 1

## 2020-01-17 MED ORDER — FENTANYL-BUPIVACAINE-NACL 0.5-0.125-0.9 MG/250ML-% EP SOLN
12.0000 mL/h | EPIDURAL | Status: DC | PRN
  Filled 2020-01-17: qty 250

## 2020-01-17 MED ORDER — PHENYLEPHRINE 40 MCG/ML (10ML) SYRINGE FOR IV PUSH (FOR BLOOD PRESSURE SUPPORT): 80.0000 ug | PREFILLED_SYRINGE | INTRAVENOUS | Status: DC | PRN

## 2020-01-17 MED ORDER — MISOPROSTOL 50MCG HALF TABLET
50.0000 ug | ORAL_TABLET | ORAL | Status: DC | PRN
  Administered 2020-01-17 (×2): 50 ug via ORAL
  Filled 2020-01-17 (×2): qty 1

## 2020-01-17 MED ORDER — ONDANSETRON HCL 4 MG/2ML IJ SOLN: 4.0000 mg | Freq: Four times a day (QID) | INTRAMUSCULAR | Status: DC | PRN

## 2020-01-17 MED ORDER — DIPHENHYDRAMINE HCL 50 MG/ML IJ SOLN: 12.5000 mg | INTRAMUSCULAR | Status: DC | PRN

## 2020-01-17 MED ORDER — LIDOCAINE HCL (PF) 1 % IJ SOLN: 30.0000 mL | INTRAMUSCULAR | Status: DC | PRN

## 2020-01-17 MED ORDER — TERBUTALINE SULFATE 1 MG/ML IJ SOLN: 0.2500 mg | Freq: Once | INTRAMUSCULAR | Status: DC | PRN

## 2020-01-17 MED ORDER — ACETAMINOPHEN 325 MG PO TABS: 650.0000 mg | ORAL_TABLET | ORAL | Status: DC | PRN

## 2020-01-17 MED ORDER — EPHEDRINE 5 MG/ML INJ: 10.0000 mg | INTRAVENOUS | Status: DC | PRN

## 2020-01-17 MED ORDER — LACTATED RINGERS IV SOLN: 500.0000 mL | INTRAVENOUS | Status: DC | PRN

## 2020-01-17 MED ORDER — OXYTOCIN BOLUS FROM INFUSION
333.0000 mL | Freq: Once | INTRAVENOUS | Status: AC
  Administered 2020-01-18: 333 mL via INTRAVENOUS

## 2020-01-17 MED ORDER — SOD CITRATE-CITRIC ACID 500-334 MG/5ML PO SOLN
30.0000 mL | ORAL | Status: DC | PRN
  Administered 2020-01-17: 30 mL via ORAL
  Filled 2020-01-17: qty 15

## 2020-01-17 MED ORDER — OXYCODONE-ACETAMINOPHEN 5-325 MG PO TABS: 2.0000 | ORAL_TABLET | ORAL | Status: DC | PRN

## 2020-01-17 MED ORDER — PENICILLIN G POT IN DEXTROSE 60000 UNIT/ML IV SOLN
3.0000 10*6.[IU] | INTRAVENOUS | Status: DC
  Administered 2020-01-17 – 2020-01-18 (×4): 3 10*6.[IU] via INTRAVENOUS
  Filled 2020-01-17 (×4): qty 50

## 2020-01-17 MED ORDER — LACTATED RINGERS IV SOLN: 500.0000 mL | Freq: Once | INTRAVENOUS | Status: DC

## 2020-01-17 MED ORDER — LACTATED RINGERS IV SOLN: INTRAVENOUS | Status: DC

## 2020-01-17 MED ORDER — OXYCODONE-ACETAMINOPHEN 5-325 MG PO TABS: 1.0000 | ORAL_TABLET | ORAL | Status: DC | PRN

## 2020-01-17 MED ORDER — SODIUM CHLORIDE 0.9 % IV SOLN
5.0000 10*6.[IU] | Freq: Once | INTRAVENOUS | Status: AC
  Administered 2020-01-17: 5 10*6.[IU] via INTRAVENOUS
  Filled 2020-01-17: qty 5

## 2020-01-17 MED ORDER — FENTANYL CITRATE (PF) 100 MCG/2ML IJ SOLN
100.0000 ug | INTRAMUSCULAR | Status: DC | PRN
  Administered 2020-01-18 (×2): 100 ug via INTRAVENOUS
  Filled 2020-01-17 (×2): qty 2

## 2020-01-17 MED ORDER — PARAGARD INTRAUTERINE COPPER IU IUD
INTRAUTERINE_SYSTEM | Freq: Once | INTRAUTERINE | Status: DC
  Filled 2020-01-17: qty 1

## 2020-01-17 MED ORDER — OXYTOCIN-SODIUM CHLORIDE 30-0.9 UT/500ML-% IV SOLN
2.5000 [IU]/h | INTRAVENOUS | Status: DC
  Filled 2020-01-17: qty 500

## 2020-01-17 NOTE — Progress Notes (Signed)
Called by Dr Adine Madura about patient care plan. Patient with h/o preterm birth due to abruption and FGR 11th% with MFM recommendation that she can be delivered after 39wk and presented to office today with decreased fetal movement in office today (01/17/20).   Decision to induce due to decreased FM in conjunction with FGR. Patient will be direct admission and LD Charge is aware. Dr. Debroah Loop will discuss with patient in the office.

## 2020-01-17 NOTE — Patient Instructions (Signed)

## 2020-01-17 NOTE — Progress Notes (Addendum)
Briana Wagner is a 29 y.o. [phone] at [redacted]w[redacted]d by ultrasound admitted for induction of labor due to decreased fetal movement.  Subjective: Patient comfortable at this time, no complaints. Denies vaginal bleeding, leaking of fluid, or pressure. No distress noted at this time.    Objective: BP 131/85   Pulse 79   Temp 98.2 F (36.8 C) (Oral)   Ht 5\' 3"  (1.6 m)   Wt 93.2 kg   LMP 04/15/2019 (Approximate) Comment: pt shielded  BMI 36.40 kg/m  No intake/output data recorded. No intake/output data recorded.  FHT:  FHR: 140 bpm, variability: moderate,  accelerations:  Present,  decelerations:  Absent UC:   none SVE:   Dilation: 2 Effacement (%): 60 Station: -2 Exam by:: [name], SNM  Labs: Lab Results  Component Value Date   WBC 8.8 01/17/2020   HGB 14.4 01/17/2020   HCT 42.9 01/17/2020   MCV 92.7 01/17/2020   PLT 208 01/17/2020    Assessment / Plan: IOL for decreased FM - Foley Bulb placed at this time, Cytotec ordered  Labor: currently not laboring, IOL using FB Preeclampsia:  n/a Fetal Wellbeing:  Category I Pain Control:  Labor support without medications I/D:  n/a Anticipated MOD:  NSVD  01/19/2020 01/17/2020, 4:33 PM   Midwife attestation I agree with the documentation in the student's note.   01/19/2020, CNM 5:21 PM

## 2020-01-17 NOTE — Progress Notes (Signed)
Consent obtained for Paragard IUD post delivery, per order.

## 2020-01-17 NOTE — Progress Notes (Signed)
Labor Progress Note MALVIKA TUNG is a 29 y.o. [phone] at [redacted]w[redacted]d presented for IOL for FGR, DFM S: Feeling crampy.   O:  BP 121/67   Pulse 91   Temp 98.2 F (36.8 C) (Oral)   Ht 5\' 3"  (1.6 m)   Wt 93.2 kg   LMP 04/15/2019 (Approximate) Comment: pt shielded  BMI 36.40 kg/m  EFM: 130/mod variability/pos accels/no decels Toco: Irritability   CVE: Dilation: 2.5 Effacement (%): 60 Cervical Position: Middle Station: -2 Presentation: Vertex Exam by:: [email or web address], RN   A&P: 29 y.o. 34 [redacted]w[redacted]d here for IOL for DFM in setting of EFW 11%ile.   #Induction of labor: s/p cytotec, FB at 1555. Repeat cytotec at 2000. Recheck in four hours.  #Pain: epidural, pain meds prn  #FWB: Cat I  #GBS Pos, PCN  #Contraception: discussed IUD options with patient, leaning towards paragard but would like to think about it.   [redacted]w[redacted]d, MD 8:36 PM

## 2020-01-17 NOTE — Progress Notes (Signed)
   PRENATAL VISIT NOTE  Subjective:  Briana Wagner is a 29 y.o. [phone] at [redacted]w[redacted]d being seen today for ongoing prenatal care.  She is currently monitored for the following issues for this high-risk pregnancy and has Supervision of high-risk pregnancy; History of preterm delivery, currently pregnant; History of placental abruption; and GBS (group B Streptococcus carrier), +RV culture, currently pregnant on their problem list.  Patient reports decreased FM.  Contractions: Not present. Vag. Bleeding: None.  Movement: (!) Decreased. Denies leaking of fluid.   The following portions of the patient's history were reviewed and updated as appropriate: allergies, current medications, past family history, past medical history, past social history, past surgical history and problem list.   Objective:   Vitals:   01/17/20 0914  BP: 120/87  Pulse: 88  Weight: 204 lb (92.5 kg)    Fetal Status: Fetal Heart Rate (bpm): 147   Movement: (!) Decreased     General:  Alert, oriented and cooperative. Patient is in no acute distress.  Skin: Skin is warm and dry. No rash noted.   Cardiovascular: Normal heart rate noted  Respiratory: Normal respiratory effort, no problems with respiration noted  Abdomen: Soft, gravid, appropriate for gestational age.  Pain/Pressure: Absent     Pelvic: Cervical exam performed in the presence of a chaperone        Extremities: Normal range of motion.  Edema: Trace  Mental Status: Normal mood and affect. Normal behavior. Normal judgment and thought content.   Assessment and Plan:  Pregnancy: [id number] at [redacted]w[redacted]d 1. [redacted] weeks gestation of pregnancy   2. History of placental abruption At 34 week  3. Supervision of high risk pregnancy in third trimester Decreased fm and 11% growth, recommend delivery. Discussed with Dr. Kirtland Bouchard.[place] and will direct admit  Term labor symptoms and general obstetric precautions including but not limited to vaginal bleeding, contractions, leaking of  fluid and fetal movement were reviewed in detail with the patient. Please refer to After Visit Summary for other counseling recommendations.   Return if symptoms worsen or fail to improve.  No future appointments.  Scheryl Darter, MD

## 2020-01-17 NOTE — H&P (Addendum)
Briana Wagner is a 29 y.o. female [phone] with IUP at 51w0dby [place] for induction of labor for decreased fetal movement.   Reports fetal movement, but decreased from prior movement in this pregnancy. Patient states she feels baby move twice in an hour now instead of ten times per hour. Denies vaginal bleeding. Denies leaking of fluid. Patient states she feels pressure in her lower abdomen, but denies contractions. No distress noted at this time.  [facility]  Support person in labor: FOB  Ultrasounds . Anatomy U/S: 08/31/2019 - normal except EIF left ventricle . [place] FOLLOW UP- 01/13/2020 - Normal; AFI and fetal growth appropriate for gestational age . EFW 11% - delivery recommended at 39 weeks or greater  Prenatal History/Complications: . Increased Carrier Risk for SMA . Asthma  OB BOX:  Nursing Staff Provider  Office Location  [facility]  Dating  6 week scan (unsure LMP)  Language  ENGLISH  Anatomy [place] Normal  Flu Vaccine  No per pt 07/07/19 Declines COVID -19 vaccine Genetic Screen  NIPS:   AFP:   First Screen:  Quad:    TDaP vaccine   10/06/19 Hgb A1C or  GTT Early  Third trimester nl 2 hour  Rhogam  NA   LAB RESULTS     Blood Type O/Positive/-- (04/22 0000)   Feeding Plan Breast  Antibody Negative (04/22 0000)  Contraception None  Rubella Immune (04/22 0000)  Circumcision Yes if a Boy  RPR Non Reactive (09/09 1011)   Pediatrician  [facility] HBsAg Negative (04/22 0000)   Support Person FOB  HCVAb   Prenatal Classes no HIV Non Reactive (09/09 1011)       GBS  positive    Pap Normal 06/30/2019    Hgb Electro    BP Cuff 07/07/2019 CF     SMA     Waterbirth  '[ ]'  Class '[ ]'  Consent '[ ]'  CNM visit    Induction  '[ ]'  Orders Entered '[ ]' Foley Y/N    Past Medical History: Past Medical History:  Diagnosis Date  . Asthma    last used inhaler 231mogo  . Bronchitis   . BV (bacterial vaginosis)   . Chlamydia     Past Surgical  History: Past Surgical History:  Procedure Laterality Date  . abortion     x2  . WICowardXTRACTION  2014  . WISDOM TOOTH EXTRACTION      Obstetrical History: OB History    Gravida  4   Para  1   Term      Preterm  1   AB  2   Living  1     SAB      TAB  2   Ectopic      Multiple  0   Live Births  1           Social History: Social History   Socioeconomic History  . Marital status: Single    Spouse name: Not on file  . Number of children: Not on file  . Years of education: Not on file  . Highest education level: Not on file  Occupational History  . Not on file  Tobacco Use  . Smoking status: Current Every Day Smoker    Packs/day: 0.25    Years: 9.00    Pack years: 2.25    Types: Cigarettes  . Smokeless tobacco: Never Used  Vaping Use  . Vaping  Use: Former  Substance and Sexual Activity  . Alcohol use: No  . Drug use: No  . Sexual activity: Yes  Other Topics Concern  . Not on file  Social History Narrative  . Not on file   Social Determinants of Health   Financial Resource Strain:   . Difficulty of Paying Living Expenses: Not on file  Food Insecurity:   . Worried About Charity fundraiser in the Last Year: Not on file  . Ran Out of Food in the Last Year: Not on file  Transportation Needs:   . Lack of Transportation (Medical): Not on file  . Lack of Transportation (Non-Medical): Not on file  Physical Activity:   . Days of Exercise per Week: Not on file  . Minutes of Exercise per Session: Not on file  Stress:   . Feeling of Stress : Not on file  Social Connections:   . Frequency of Communication with Friends and Family: Not on file  . Frequency of Social Gatherings with Friends and Family: Not on file  . Attends Religious Services: Not on file  . Active Member of Clubs or Organizations: Not on file  . Attends Archivist Meetings: Not on file  . Marital Status: Not on file    Family History: Family History   Problem Relation Age of Onset  . Hypertension Mother   . Aneurysm Mother   . Stroke Mother   . Heart disease Father        3 MIs, stints  . Hypertension Father   . COPD Father     Allergies: Allergies  Allergen Reactions  . Shellfish Allergy Hives and Swelling    Medications Prior to Admission  Medication Sig Dispense Refill Last Dose  . albuterol (VENTOLIN HFA) 108 (90 Base) MCG/ACT inhaler Inhale 2 puffs into the lungs every 4 (four) hours as needed for wheezing or shortness of breath. 8 g 1   . aspirin 81 MG chewable tablet Chew 1 tablet (81 mg total) by mouth daily. 30 tablet 5   . Blood Pressure Monitor KIT 1 Device by Does not apply route once a week. To be monitored Regularly at home. 1 kit 0   . Prenatal Vit-Iron Carbonyl-FA (PRENATAL PLUS IRON) 29-1 MG TABS Take 1 tablet by mouth every morning. 30 tablet 3      Review of Systems  All systems reviewed and negative except as stated in HPI  Blood pressure 131/85, pulse 79, temperature 98.2 F (36.8 C), temperature source Oral, height '5\' 3"'  (1.6 m), weight 93.2 kg, last menstrual period 04/15/2019, unknown if currently breastfeeding. General appearance: alert, cooperative, appears stated age and no distress Lungs: no respiratory distress Heart: regular rate  Abdomen: soft, non-tender; gravid b Pelvic: adequate for delivery Extremities: Homans sign is negative, no sign of DVT Presentation: cephalic confirmed by BSUS Fetal monitoring: 140 bpm, moderate variability, accels present, decels absent Uterine activity: irritability with occasional UCs Dilation: 2 Effacement (%): 60 Station: -2 Exam by:: [name], SNM  Prenatal labs: ABO, Rh: --/--/O POS (11/23 1315) Antibody: NEG (11/23 1315) Rubella: Immune (04/22 0000) RPR: Non Reactive (09/09 1011)  HBsAg: Negative (04/22 0000)  HIV: Non Reactive (09/09 1011)  GBS: Positive/-- (11/03 1040)  Glucola: 2 hr - 105 on 11/03/2019 Genetic screening: Patient- increased  carrier risk for SMA Infant - Low risk for all screening  Prenatal Transfer Tool  Maternal Diabetes: No Genetic Screening: Normal Maternal Ultrasounds/Referrals: Isolated EIF (echogenic intracardiac focus) Fetal Ultrasounds or other  Referrals:  None Maternal Substance Abuse:  No Significant Maternal Medications:  None Significant Maternal Lab Results: Group B Strep positive  Results for orders placed or performed during the hospital encounter of 01/17/20 (from the past 24 hour(s))  Respiratory Panel by RT PCR (Flu A&B, Covid) - Nasopharyngeal Swab   Collection Time: 01/17/20 12:55 PM   Specimen: Nasopharyngeal Swab; Nasopharyngeal(NP) swabs in vial transport medium  Result Value Ref Range   SARS Coronavirus 2 by RT PCR NEGATIVE NEGATIVE   Influenza A by PCR NEGATIVE NEGATIVE   Influenza B by PCR NEGATIVE NEGATIVE  Type and screen   Collection Time: 01/17/20  1:15 PM  Result Value Ref Range   ABO/RH(D) O POS    Antibody Screen NEG    Sample Expiration      01/20/2020,2359 Performed at [facility], [place] [address] [place], [place] [address]   CBC   Collection Time: 01/17/20  1:30 PM  Result Value Ref Range   WBC 8.8 4.0 - 10.5 K/uL   RBC 4.63 3.87 - 5.11 MIL/uL   Hemoglobin 14.4 12.0 - 15.0 g/dL   HCT 42.9 36 - 46 %   MCV 92.7 80.0 - 100.0 fL   MCH 31.1 26.0 - 34.0 pg   MCHC 33.6 30.0 - 36.0 g/dL   RDW 13.9 11.5 - 15.5 %   Platelets 208 150 - 400 K/uL   nRBC 0.0 0.0 - 0.2 %    Patient Active Problem List   Diagnosis Date Noted  . Decreased fetal movement 01/17/2020  . GBS (group B Streptococcus carrier), +RV culture, currently pregnant 01/02/2020  . History of preterm delivery, currently pregnant 08/11/2019  . History of placental abruption 08/11/2019  . Supervision of high-risk pregnancy 07/07/2019    Assessment/Plan:  Briana Wagner is a 29 y.o. [phone] at 7w0dhere for IOL due to decreased fetal movement   Labor:  -- pain control: undecided, but  prefers IV meds before epidural  Fetal Wellbeing: EFW 2804 gm 6 lb 3 oz by follow up [place] 01/13/2020. Cephalic by bedside [place] admission to L&D today 01/17/2020  -- GBS positive>PCN -- continuous fetal monitoring - due to pt's report of decreased fetal movement  Postpartum Planning -- boy/bottle/desires circumcision -- Contraception: PP IUD   JValli Glance Student-MidWife  01/17/2020, 4:02 PM   Midwife attestation: I have seen and examined this patient; I agree with above documentation in the student's note.   PE: Gen: calm comfortable, NAD Resp: normal effort and rate Abd: gravid  ROS, labs, PMH reviewed  Assessment/Plan: [redacted] weeks gestation Labor: latent FWB: Cat I GBS: pos Admit to LD Cytotec and FB for ripening Anticipate SVD  MJulianne Handler CNM  01/17/2020, 5:17 PM

## 2020-01-18 ENCOUNTER — Inpatient Hospital Stay (HOSPITAL_COMMUNITY): Payer: Medicaid Other | Admitting: Anesthesiology

## 2020-01-18 ENCOUNTER — Encounter (HOSPITAL_COMMUNITY): Payer: Self-pay | Admitting: Family Medicine

## 2020-01-18 DIAGNOSIS — Z30430 Encounter for insertion of intrauterine contraceptive device: Secondary | ICD-10-CM

## 2020-01-18 DIAGNOSIS — O99824 Streptococcus B carrier state complicating childbirth: Secondary | ICD-10-CM

## 2020-01-18 DIAGNOSIS — O368130 Decreased fetal movements, third trimester, not applicable or unspecified: Secondary | ICD-10-CM

## 2020-01-18 DIAGNOSIS — Z3A39 39 weeks gestation of pregnancy: Secondary | ICD-10-CM

## 2020-01-18 LAB — RPR: RPR Ser Ql: NONREACTIVE

## 2020-01-18 MED ORDER — OXYCODONE HCL 5 MG PO TABS
5.0000 mg | ORAL_TABLET | ORAL | Status: DC | PRN
  Administered 2020-01-18 – 2020-01-19 (×3): 5 mg via ORAL
  Filled 2020-01-18 (×3): qty 1

## 2020-01-18 MED ORDER — PARAGARD INTRAUTERINE COPPER IU IUD
INTRAUTERINE_SYSTEM | Freq: Once | INTRAUTERINE | Status: AC
  Administered 2020-01-18: 1 via INTRAUTERINE

## 2020-01-18 MED ORDER — OXYCODONE HCL 5 MG PO TABS: 10.0000 mg | ORAL_TABLET | ORAL | Status: DC | PRN

## 2020-01-18 MED ORDER — ONDANSETRON HCL 4 MG/2ML IJ SOLN: 4.0000 mg | INTRAMUSCULAR | Status: DC | PRN

## 2020-01-18 MED ORDER — TERBUTALINE SULFATE 1 MG/ML IJ SOLN: 0.2500 mg | Freq: Once | INTRAMUSCULAR | Status: DC | PRN

## 2020-01-18 MED ORDER — ONDANSETRON HCL 4 MG PO TABS: 4.0000 mg | ORAL_TABLET | ORAL | Status: DC | PRN

## 2020-01-18 MED ORDER — PRENATAL MULTIVITAMIN CH
1.0000 | ORAL_TABLET | Freq: Every day | ORAL | Status: DC
  Administered 2020-01-18 – 2020-01-19 (×2): 1 via ORAL
  Filled 2020-01-18 (×2): qty 1

## 2020-01-18 MED ORDER — ACETAMINOPHEN 325 MG PO TABS
650.0000 mg | ORAL_TABLET | ORAL | Status: DC | PRN
  Administered 2020-01-18 – 2020-01-19 (×3): 650 mg via ORAL
  Filled 2020-01-18 (×3): qty 2

## 2020-01-18 MED ORDER — BENZOCAINE-MENTHOL 20-0.5 % EX AERO: 1.0000 "application " | INHALATION_SPRAY | CUTANEOUS | Status: DC | PRN

## 2020-01-18 MED ORDER — SENNOSIDES-DOCUSATE SODIUM 8.6-50 MG PO TABS
2.0000 | ORAL_TABLET | ORAL | Status: DC
  Administered 2020-01-19: 2 via ORAL
  Filled 2020-01-18 (×2): qty 2

## 2020-01-18 MED ORDER — DIBUCAINE (PERIANAL) 1 % EX OINT: 1.0000 "application " | TOPICAL_OINTMENT | CUTANEOUS | Status: DC | PRN

## 2020-01-18 MED ORDER — WITCH HAZEL-GLYCERIN EX PADS: 1.0000 "application " | MEDICATED_PAD | CUTANEOUS | Status: DC | PRN

## 2020-01-18 MED ORDER — OXYTOCIN-SODIUM CHLORIDE 30-0.9 UT/500ML-% IV SOLN
1.0000 m[IU]/min | INTRAVENOUS | Status: DC
  Administered 2020-01-18: 2 m[IU]/min via INTRAVENOUS

## 2020-01-18 MED ORDER — LIDOCAINE HCL (PF) 1 % IJ SOLN
INTRAMUSCULAR | Status: DC | PRN
  Administered 2020-01-18 (×2): 4 mL via EPIDURAL

## 2020-01-18 MED ORDER — DIPHENHYDRAMINE HCL 25 MG PO CAPS: 25.0000 mg | ORAL_CAPSULE | Freq: Four times a day (QID) | ORAL | Status: DC | PRN

## 2020-01-18 MED ORDER — SODIUM CHLORIDE (PF) 0.9 % IJ SOLN
INTRAMUSCULAR | Status: DC | PRN
  Administered 2020-01-18: 12 mL/h via EPIDURAL

## 2020-01-18 MED ORDER — COCONUT OIL OIL: 1.0000 "application " | TOPICAL_OIL | Status: DC | PRN

## 2020-01-18 MED ORDER — IBUPROFEN 600 MG PO TABS
600.0000 mg | ORAL_TABLET | Freq: Four times a day (QID) | ORAL | Status: DC
  Administered 2020-01-18 – 2020-01-20 (×8): 600 mg via ORAL
  Filled 2020-01-18 (×8): qty 1

## 2020-01-18 MED ORDER — SIMETHICONE 80 MG PO CHEW: 80.0000 mg | CHEWABLE_TABLET | ORAL | Status: DC | PRN

## 2020-01-18 NOTE — Progress Notes (Addendum)
Labor Progress Note SHANNIN NAAB is a 29 y.o. [phone] at [redacted]w[redacted]d presented for IOL for FGR, DFM S: Feeling a lot of pressure   O:  BP 116/80   Pulse 76   Temp 98.5 F (36.9 C) (Oral)   Resp 18   Ht 5\' 3"  (1.6 m)   Wt 93.2 kg   LMP 04/15/2019 (Approximate) Comment: pt shielded  BMI 36.40 kg/m  EFM: 125/mod variability/pos accels/intermittent late and variable decels  Toco: q3-4 min   CVE: Dilation: 6 Effacement (%): 80 Cervical Position: Middle Station: -1 Presentation: Vertex Exam by:: [email or web address], RN   A&P: 29 y.o. 34 [redacted]w[redacted]d here for IOL for DFM in setting of EFW 11%ile.   #Induction of labor: s/p cytotec x2, s/p FB. Pitocin started at 0041, SROM at 0400 for clear fluid. Continue to titrate pitocin. Anticipate SVD.   #Pain: epidural, pain meds prn  #FWB: Cat II but reassuring due to mod variability, quick return to baseline. Will consider amnioinfusion if persistent variables.  #GBS Pos, PCN  #Contraception: has decided on Paragard. Consented and ordered.   [redacted]w[redacted]d, MD 6:33 AM

## 2020-01-18 NOTE — Discharge Summary (Signed)
Postpartum Discharge Summary  Date of Service updated 01/20/20     Patient Name: Briana Wagner DOB: 12-24-1990 MRN: [id number]  Date of admission: 01/17/2020 Delivery date:01/18/2020  Delivering provider: Gabriel Carina  [facility] [place]) Date of discharge: 01/20/2020  Admitting diagnosis: Decreased fetal movement [O36.8190] Intrauterine pregnancy: [redacted]w[redacted]d    Secondary diagnosis:  Active Problems:   Vaginal delivery   Supervision of high-risk pregnancy   History of preterm delivery, currently pregnant   GBS (group B Streptococcus carrier), +RV culture, currently pregnant  Additional problems: None    Discharge diagnosis: Term Pregnancy Delivered                                              Post partum procedures:PP IUD (Paraguard) Augmentation: AROM, Pitocin, Cytotec and IP Foley Complications: None  Hospital course: Induction of Labor With Vaginal Delivery   29y.o. yo [id number] 350w1das admitted to the hospital 01/17/2020 for induction of labor.  Indication for induction: DFM.  Patient had an uncomplicated labor course as follows: Membrane Rupture Time/Date: 3:44 AM ,01/18/2020   Delivery Method:Vaginal, Spontaneous  Episiotomy: None  Lacerations:  None  Details of delivery can be found in separate delivery note.  Patient had a routine postpartum course. Patient is discharged home 01/20/20.  Newborn Data: Birth date:01/18/2020  Birth time:8:50 AM  Gender:Female  Living status:Living  Apgars:9 ,9  Weight:3050 g   Magnesium Sulfate received: No BMZ received: No Rhophylac:N/A MMR:N/A T-DaP:Given prenatally Flu: No Transfusion:No  Physical exam  Vitals:   01/19/20 0537 01/19/20 1516 01/19/20 2200 01/20/20 0612  BP: 118/75 128/89 123/75 125/80  Pulse: 78 72 94 82  Resp: _0 Temp: 98 F (36.7 C) 98.2 F (36.8 C) 97.9 F (36.6 C) 97.7 F (36.5 C)  TempSrc: Oral Oral Oral Oral  SpO2: 100% 100% 97% 100%  Weight:      Height:        General: alert, cooperative and no distress Lochia: appropriate Uterine Fundus: firm Incision: N/A DVT Evaluation: No evidence of DVT seen on physical exam. Labs: Lab Results  Component Value Date   WBC 15.0 (H) 01/19/2020   HGB 13.0 01/19/2020   HCT 38.5 01/19/2020   MCV 94.4 01/19/2020   PLT 184 01/19/2020   CMP Latest Ref Rng & Units 05/28/2019  Glucose 70 - 99 mg/dL 93  BUN 6 - 20 mg/dL 10  Creatinine 0.44 - 1.00 mg/dL 0.84  Sodium 135 - 145 mmol/L 136  Potassium 3.5 - 5.1 mmol/L 3.9  Chloride 98 - 111 mmol/L 100  CO2 22 - 32 mmol/L 21(L)  Calcium 8.9 - 10.3 mg/dL 9.5  Total Protein 6.5 - 8.1 g/dL 7.7  Total Bilirubin 0.3 - 1.2 mg/dL 0.5  Alkaline Phos 38 - 126 U/L 60  AST 15 - 41 U/L 22  ALT 0 - 44 U/L 13   Edinburgh Score: Edinburgh Postnatal Depression Scale Screening Tool 01/18/2020  I have been able to laugh and see the funny side of things. (No Data)     After visit meds:  Allergies as of 01/20/2020      Reactions   Shellfish Allergy Hives, Swelling      Medication List    STOP taking these medications   aspirin 81 MG chewable tablet     TAKE these medications   acetaminophen 325  MG tablet Commonly known as: Tylenol Take 2 tablets (650 mg total) by mouth every 4 (four) hours as needed (for pain scale < 4).   albuterol 108 (90 Base) MCG/ACT inhaler Commonly known as: VENTOLIN HFA Inhale 2 puffs into the lungs every 4 (four) hours as needed for wheezing or shortness of breath.   Blood Pressure Monitor Kit 1 Device by Does not apply route once a week. To be monitored Regularly at home.   ibuprofen 600 MG tablet Commonly known as: ADVIL Take 1 tablet (600 mg total) by mouth every 6 (six) hours.   Prenatal Plus Iron 29-1 MG Tabs Take 1 tablet by mouth every morning.        Discharge home in stable condition Infant Feeding: Bottle Infant Disposition:home with mother Discharge instruction: per After Visit Summary and Postpartum  booklet. Activity: Advance as tolerated. Pelvic rest for 6 weeks.  Diet: routine diet Future Appointments: Future Appointments  Date Time Provider [place]  02/15/2020  2:45 PM Griffin Basil, MD [place] None   Follow up Visit:  [place] Follow up.   Why: In 4-6 weeks for postpartum visit Contact information: [address] [place] [address] [phone]               Please schedule this patient for a In person postpartum visit in 4 weeks with the following provider: Any provider. Additional Postpartum F/U:IUD string check  Low risk pregnancy complicated by: None Delivery mode:  Vaginal, Spontaneous  Anticipated Birth Control:  PP IUD placed   Fatima Blank, CNM 9:36 AM

## 2020-01-18 NOTE — Anesthesia Preprocedure Evaluation (Signed)
Anesthesia Evaluation  Patient identified by MRN, date of birth, ID band Patient awake    Reviewed: Allergy & Precautions, Patient's Chart, lab work & pertinent test results  Airway Mallampati: II  TM Distance: >3 FB Neck ROM: Full    Dental no notable dental hx.    Pulmonary asthma , Current Smoker and Patient abstained from smoking.,    Pulmonary exam normal breath sounds clear to auscultation       Cardiovascular negative cardio ROS Normal cardiovascular exam Rhythm:Regular Rate:Normal     Neuro/Psych negative psych ROS   GI/Hepatic Neg liver ROS, GERD  ,  Endo/Other  Obesity  Renal/GU negative Renal ROS  negative genitourinary   Musculoskeletal negative musculoskeletal ROS (+)   Abdominal (+) + obese,   Peds  Hematology negative hematology ROS (+)   Anesthesia Other Findings   Reproductive/Obstetrics (+) Pregnancy                             Anesthesia Physical Anesthesia Plan  ASA: II  Anesthesia Plan: Epidural   Post-op Pain Management:    Induction:   PONV Risk Score and Plan:   Airway Management Planned: Natural Airway  Additional Equipment:   Intra-op Plan:   Post-operative Plan:   Informed Consent: I have reviewed the patients History and Physical, chart, labs and discussed the procedure including the risks, benefits and alternatives for the proposed anesthesia with the patient or authorized representative who has indicated his/her understanding and acceptance.       Plan Discussed with: Anesthesiologist  Anesthesia Plan Comments:         Anesthesia Quick Evaluation

## 2020-01-18 NOTE — Discharge Instructions (Signed)

## 2020-01-18 NOTE — Progress Notes (Signed)
Labor Progress Note Briana Wagner is a 29 y.o. [phone] at [redacted]w[redacted]d presented for IOL for FGR, DFM S: Doing well, some cramping but has not picked up in intensity   O:  BP 123/75    Pulse 89    Temp 98 F (36.7 C) (Oral)    Resp 18    Ht 5\' 3"  (1.6 m)    Wt 93.2 kg    LMP 04/15/2019 (Approximate) Comment: pt shielded   BMI 36.40 kg/m  EFM: 130/mod variability/pos accels/no decels Toco: q3-4 min   CVE: Dilation: 3 Effacement (%): 50 Cervical Position: Middle Station: -3 Presentation: Vertex Exam by:: Dr. [email or web address]   A&P: 29 y.o. 34 [redacted]w[redacted]d here for IOL for DFM in setting of EFW 11%ile.   #Induction of labor: s/p cytotec x2, FB placed at 1555, now out. Will start pitocin at this time, attempt AROM when able #Pain: epidural, pain meds prn  #FWB: Cat I  #GBS Pos, PCN  #Contraception: has decided on Paragard. Consented and ordered.   [redacted]w[redacted]d, MD 12:35 AM

## 2020-01-18 NOTE — Procedures (Signed)
  Post-Placental IUD Insertion Procedure Note  Patient identified, informed consent signed prior to delivery, signed copy in chart, time out was performed.    - IUD grasped between sterile gloved fingers. Sterile lubrication applied to sterile gloved hand for ease of insertion. Fundus identified through abdominal wall using non-insertion hand. IUD inserted to fundus with bimanual technique. IUD carefully released at the fundus and insertion hand gently removed from vagina.   Patient given post procedure instructions and IUD care card with expiration date.  Patient is asked to keep IUD strings tucked in her vagina until her postpartum follow up visit in 4-6 weeks. Patient advised to abstain from sexual intercourse and pulling on strings before her follow-up visit. Patient verbalized an understanding of the plan of care and agrees.    Alric Seton, MD OB Fellow, Faculty [facility] 01/18/2020 10:42 AM

## 2020-01-18 NOTE — Anesthesia Procedure Notes (Signed)
Epidural Patient location during procedure: OB Start time: 01/18/2020 5:23 AM End time: 01/18/2020 5:31 AM  Staffing Anesthesiologist: Mal Amabile, MD Performed: anesthesiologist   Preanesthetic Checklist Completed: patient identified, IV checked, site marked, risks and benefits discussed, surgical consent, monitors and equipment checked, pre-op evaluation and timeout performed  Epidural Patient position: sitting Prep: DuraPrep and site prepped and draped Patient monitoring: continuous pulse ox and blood pressure Approach: midline Location: L3-L4 Injection technique: LOR air  Needle:  Needle type: Tuohy  Needle gauge: 17 G Needle length: 9 cm and 9 Needle insertion depth: 6 cm Catheter type: closed end flexible Catheter size: 19 Gauge Catheter at skin depth: 11 cm Test dose: negative and Other  Assessment Events: blood not aspirated, injection not painful, no injection resistance, no paresthesia and negative IV test  Additional Notes Patient identified. Risks and benefits discussed including failed block, incomplete  Pain control, post dural puncture headache, nerve damage, paralysis, blood pressure Changes, nausea, vomiting, reactions to medications-both toxic and allergic and post Partum back pain. All questions were answered. Patient expressed understanding and wished to proceed. Sterile technique was used throughout procedure. Epidural site was Dressed with sterile barrier dressing. No paresthesias, signs of intravascular injection Or signs of intrathecal spread were encountered.  Patient was more comfortable after the epidural was dosed. Please see RN's note for documentation of vital signs and FHR which are stable. Reason for block:procedure for pain

## 2020-01-18 NOTE — Anesthesia Postprocedure Evaluation (Signed)
Anesthesia Post Note  Patient: Briana Wagner  Procedure(s) Performed: AN AD HOC LABOR EPIDURAL     Patient location during evaluation: Mother Baby Anesthesia Type: Epidural Level of consciousness: awake and alert Pain management: pain level controlled Vital Signs Assessment: post-procedure vital signs reviewed and stable Respiratory status: spontaneous breathing, nonlabored ventilation and respiratory function stable Cardiovascular status: stable Postop Assessment: no headache, no backache and epidural receding Anesthetic complications: no   No complications documented.  Last Vitals:  Vitals:   01/18/20 1209 01/18/20 1210  BP:  124/82  Pulse:  82  Resp:  18  Temp:  36.7 C  SpO2: 100% 100%    Last Pain:  Vitals:   01/18/20 1210  TempSrc: Oral  PainSc: 4    Pain Goal: Patients Stated Pain Goal: 3 (01/18/20 1210)                 Fanny Dance

## 2020-01-19 LAB — CBC
HCT: 38.5 % (ref 36.0–46.0)
Hemoglobin: 13 g/dL (ref 12.0–15.0)
MCH: 31.9 pg (ref 26.0–34.0)
MCHC: 33.8 g/dL (ref 30.0–36.0)
MCV: 94.4 fL (ref 80.0–100.0)
Platelets: 184 10*3/uL (ref 150–400)
RBC: 4.08 MIL/uL (ref 3.87–5.11)
RDW: 13.9 % (ref 11.5–15.5)
WBC: 15 10*3/uL — ABNORMAL HIGH (ref 4.0–10.5)
nRBC: 0 % (ref 0.0–0.2)

## 2020-01-19 NOTE — Progress Notes (Signed)
Post Partum Day 1 Subjective:  Patient is doing well without complaints. Ambulating without difficulty. Voiding and passing flatus. Tolerating PO. Abdominal pain improved. Vaginal bleeding decreased.   Objective: Blood pressure 118/75, pulse 78, temperature 98 F (36.7 C), temperature source Oral, resp. rate 19, height 5\' 3"  (1.6 m), weight 93.2 kg, last menstrual period 04/15/2019, SpO2 100 %, unknown if currently breastfeeding.  Physical Exam:  General: alert, cooperative and no distress Lochia: appropriate Uterine Fundus: firm Incision: n/a DVT Evaluation: No evidence of DVT seen on physical exam.  Recent Labs    01/17/20 1330 01/19/20 0516  HGB 14.4 13.0  HCT 42.9 38.5    Assessment/Plan: PPD#1 uncomplicated VD  -doing well without complaints, meeting pp milestones  -PP paragard placed at delivery  -bottle feeding  -desires circ for baby, ordered/consented  Offered discharge today at 24 hours, patient elects to stay another night, plan for discharge tomorrow.   LOS: 2 days   01/21/20 01/19/2020, 9:09 AM

## 2020-01-20 MED ORDER — IBUPROFEN 600 MG PO TABS: 600.0000 mg | ORAL_TABLET | Freq: Four times a day (QID) | ORAL | 0 refills | Status: DC

## 2020-01-20 MED ORDER — ACETAMINOPHEN 325 MG PO TABS: 650.0000 mg | ORAL_TABLET | ORAL | 0 refills | Status: DC | PRN

## 2020-02-15 ENCOUNTER — Ambulatory Visit: Payer: Medicaid Other | Admitting: Obstetrics and Gynecology

## 2020-03-21 ENCOUNTER — Ambulatory Visit (INDEPENDENT_AMBULATORY_CARE_PROVIDER_SITE_OTHER): Payer: Medicaid Other | Admitting: Obstetrics

## 2020-03-21 ENCOUNTER — Other Ambulatory Visit: Payer: Self-pay

## 2020-03-21 ENCOUNTER — Encounter: Payer: Self-pay | Admitting: Obstetrics

## 2020-03-21 ENCOUNTER — Other Ambulatory Visit: Payer: Self-pay | Admitting: Obstetrics

## 2020-03-21 DIAGNOSIS — B3731 Acute candidiasis of vulva and vagina: Secondary | ICD-10-CM

## 2020-03-21 DIAGNOSIS — N760 Acute vaginitis: Secondary | ICD-10-CM

## 2020-03-21 DIAGNOSIS — B9689 Other specified bacterial agents as the cause of diseases classified elsewhere: Secondary | ICD-10-CM

## 2020-03-21 DIAGNOSIS — B373 Candidiasis of vulva and vagina: Secondary | ICD-10-CM

## 2020-03-21 MED ORDER — PRENATAL PLUS IRON 29-1 MG PO TABS
1.0000 | ORAL_TABLET | Freq: Every morning | ORAL | 3 refills | Status: DC
Start: 2020-03-21 — End: 2021-11-28

## 2020-03-21 MED ORDER — FLUCONAZOLE 150 MG PO TABS: 150.0000 mg | ORAL_TABLET | Freq: Once | ORAL | 0 refills | Status: AC

## 2020-03-21 MED ORDER — METRONIDAZOLE 500 MG PO TABS: 500.0000 mg | ORAL_TABLET | Freq: Two times a day (BID) | ORAL | 2 refills | Status: DC

## 2020-03-21 NOTE — Progress Notes (Signed)
..   Post Partum Visit Note  Briana Wagner is a 30 y.o. [phone] female who presents for a postpartum visit. She is 9 weeks postpartum following a normal spontaneous vaginal delivery.  I have fully reviewed the prenatal and intrapartum course. The delivery was at 39.1 gestational weeks.  Anesthesia: epidural. Postpartum course has been good. Baby is doing well. Baby is feeding by bottle - Enfamil Neuro Pro. Bleeding no bleeding. Bowel function is normal. Bladder function is normal. Patient is sexually active. Contraception method is IUD. Postpartum depression screening: negative.   The pregnancy intention screening data noted above was reviewed. Potential methods of contraception were discussed. The patient elected to proceed with IUD or IUS.    Edinburgh Postnatal Depression Scale - 03/21/20 1026      Edinburgh Postnatal Depression Scale:  In the Past 7 Days   I have been able to laugh and see the funny side of things. 0    I have looked forward with enjoyment to things. 0    I have blamed myself unnecessarily when things went wrong. 2    I have been anxious or worried for no good reason. 0    I have felt scared or panicky for no good reason. 2    Things have been getting on top of me. 2    I have been so unhappy that I have had difficulty sleeping. 0    I have felt sad or miserable. 0    I have been so unhappy that I have been crying. 0    The thought of harming myself has occurred to me. 0    Edinburgh Postnatal Depression Scale Total 6            The following portions of the patient's history were reviewed and updated as appropriate: allergies, current medications, past family history, past medical history, past social history, past surgical history and problem list.  Review of Systems A comprehensive review of systems was negative.    Objective:  BP 114/76   Pulse 74   Ht 5\' 3"  (1.6 m)   Wt 186 lb (84.4 kg)   LMP 04/15/2019 Comment: pt shielded  BMI 32.95 kg/m     General:  alert and no distress   Breasts:  inspection negative, no nipple discharge or bleeding, no masses or nodularity palpable  Lungs: clear to auscultation bilaterally  Heart:  regular rate and rhythm, S1, S2 normal, no murmur, click, rub or gallop                     Pelvic: Deferred due to menstrual bleeding  Assessment:   1. Postpartum care following vaginal delivery Rx: - Prenatal Vit-Iron Carbonyl-FA (PRENATAL PLUS IRON) 29-1 MG TABS; Take 1 tablet by mouth every morning.  Dispense: 30 tablet; Refill: 3  2. BV (bacterial vaginosis) Rx: - metroNIDAZOLE (FLAGYL) 500 MG tablet; Take 1 tablet (500 mg total) by mouth 2 (two) times daily.  Dispense: 14 tablet; Refill: 2  3. Candida vaginitis Rx: - fluconazole (DIFLUCAN) 150 MG tablet; Take 1 tablet (150 mg total) by mouth once for 1 dose.  Dispense: 1 tablet; Refill: 0   Plan:   Essential components of care per ACOG recommendations:  1.  Mood and well being: Patient with negative depression screening today. Reviewed local resources for support.  - Patient does use tobacco. If using tobacco we discussed reduction and for recently cessation risk of relapse - hx of drug use? No  2. Infant care and feeding:  -Patient currently breastmilk feeding? No  -Social determinants of health (SDOH) reviewed in EPIC. No concerns  3. Sexuality, contraception and birth spacing - Patient does not want a pregnancy in the next year.  Desired family size is 2 children.  - Reviewed forms of contraception in tiered fashion. Patient desired IUD today.   - Discussed birth spacing of 18 months  4. Sleep and fatigue -Encouraged family/partner/community support of 4 hrs of uninterrupted sleep to help with mood and fatigue  5. Physical Recovery  - Discussed patients delivery,and there were nocomplications - Patient had no lacerations - Patient has urinary incontinence? No - Patient is safe to resume physical and sexual activity  6.  Health  Maintenance - Last pap smear: unknown  7. No Chronic Disease  Coral Ceo, MD Center for [facility], [facility] 03/21/20

## 2020-05-02 ENCOUNTER — Ambulatory Visit: Payer: Medicaid Other | Admitting: Obstetrics

## 2020-05-02 ENCOUNTER — Encounter (HOSPITAL_COMMUNITY): Payer: Self-pay | Admitting: Emergency Medicine

## 2020-05-02 ENCOUNTER — Emergency Department (HOSPITAL_COMMUNITY)
Admission: EM | Admit: 2020-05-02 | Discharge: 2020-05-02 | Disposition: A | Discharge status: Home / Self Care | Payer: Medicaid Other | Attending: Emergency Medicine | Admitting: Emergency Medicine

## 2020-05-02 ENCOUNTER — Emergency Department (HOSPITAL_COMMUNITY): Payer: Medicaid Other

## 2020-05-02 DIAGNOSIS — F17210 Nicotine dependence, cigarettes, uncomplicated: Secondary | ICD-10-CM | POA: Insufficient documentation

## 2020-05-02 DIAGNOSIS — S5012XA Contusion of left forearm, initial encounter: Secondary | ICD-10-CM | POA: Diagnosis not present

## 2020-05-02 DIAGNOSIS — H1132 Conjunctival hemorrhage, left eye: Secondary | ICD-10-CM | POA: Diagnosis not present

## 2020-05-02 DIAGNOSIS — S5011XA Contusion of right forearm, initial encounter: Secondary | ICD-10-CM | POA: Insufficient documentation

## 2020-05-02 DIAGNOSIS — J45909 Unspecified asthma, uncomplicated: Secondary | ICD-10-CM | POA: Insufficient documentation

## 2020-05-02 DIAGNOSIS — S0592XA Unspecified injury of left eye and orbit, initial encounter: Secondary | ICD-10-CM | POA: Diagnosis present

## 2020-05-02 DIAGNOSIS — S0012XA Contusion of left eyelid and periocular area, initial encounter: Secondary | ICD-10-CM | POA: Diagnosis not present

## 2020-05-02 MED ORDER — HYDROCODONE-ACETAMINOPHEN 5-325 MG PO TABS
1.0000 | ORAL_TABLET | Freq: Once | ORAL | Status: AC
  Administered 2020-05-02: 1 via ORAL
  Filled 2020-05-02: qty 1
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
## 2020-05-02 NOTE — Discharge Instructions (Signed)
Cool compress to face.  May take Tylenol and ibuprofen as needed for pain.  Return for any worsening symptoms.
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
## 2020-06-11 ENCOUNTER — Ambulatory Visit: Payer: Medicaid Other | Admitting: Obstetrics and Gynecology

## 2020-10-02 ENCOUNTER — Other Ambulatory Visit (HOSPITAL_COMMUNITY)
Admission: RE | Admit: 2020-10-02 | Discharge: 2020-10-02 | Disposition: A | Discharge status: Home / Self Care | Payer: Medicaid Other | Source: Ambulatory Visit | Attending: Obstetrics and Gynecology | Admitting: Obstetrics and Gynecology

## 2020-10-02 ENCOUNTER — Ambulatory Visit (INDEPENDENT_AMBULATORY_CARE_PROVIDER_SITE_OTHER): Payer: Medicaid Other | Admitting: Obstetrics and Gynecology

## 2020-10-02 ENCOUNTER — Other Ambulatory Visit: Payer: Self-pay

## 2020-10-02 DIAGNOSIS — N898 Other specified noninflammatory disorders of vagina: Secondary | ICD-10-CM | POA: Insufficient documentation

## 2020-10-02 DIAGNOSIS — Z975 Presence of (intrauterine) contraceptive device: Secondary | ICD-10-CM

## 2020-10-02 DIAGNOSIS — Z30431 Encounter for routine checking of intrauterine contraceptive device: Secondary | ICD-10-CM | POA: Insufficient documentation

## 2020-10-02 NOTE — Progress Notes (Signed)
Pt is in office for IUD check and possible BV.   Pt has not had IUD checked since placed in January. Pt states she is on her cycle and is moderate flow.  Last pap 06/2019- normal.

## 2020-10-02 NOTE — Progress Notes (Signed)
  CC: IUD check Subjective:    Patient ID: Briana Wagner, female    DOB: 26-Aug-1990, 30 y.o.   MRN: [id number]  HPI 30 yo G4P2 seen for IUD check and concern for possible bacterial vaginitis.  Pt had postpartum paraguard placed.  She has had no issues with the device , but the strings have not been checked since placement.  Pt is also concerned about recurrent bacterial vaginosis.   Review of Systems  Genitourinary:  Positive for vaginal discharge. Negative for pelvic pain and vaginal pain.      Objective:   Physical Exam Vitals:   10/02/20 0855  BP: 132/89  Pulse: 69  SVE: paraguard IUD strings easily seen.  Strings shortened, now 2-3 cm outside of cervix. Vaginal swab taken Moderate amount of menstrual bleeding noted and cleared       Assessment & Plan:   1. Encounter for routine checking of intrauterine contraceptive device (IUD) IUD strings are appropriate and in place  2. Presence of intrauterine contraceptive device (IUD)   3. Vaginal discharge Vaginal swab pending, if positive, pt does not want a vaginal cream. She may want an alternative to flagyl since she has used it several times. - Cervicovaginal ancillary only  F/u prn  Warden Fillers, MD Faculty Attending, Center for [facility]

## 2020-10-03 LAB — CERVICOVAGINAL ANCILLARY ONLY
Bacterial Vaginitis (gardnerella): NEGATIVE
Candida Glabrata: NEGATIVE
Candida Vaginitis: NEGATIVE
Comment: NEGATIVE
Comment: NEGATIVE
Comment: NEGATIVE
Comment: NEGATIVE
Trichomonas: NEGATIVE

## 2020-10-12 ENCOUNTER — Other Ambulatory Visit: Payer: Self-pay

## 2020-10-12 DIAGNOSIS — N898 Other specified noninflammatory disorders of vagina: Secondary | ICD-10-CM

## 2020-10-12 MED ORDER — FLUCONAZOLE 150 MG PO TABS: 150.0000 mg | ORAL_TABLET | Freq: Once | ORAL | 0 refills | Status: AC

## 2020-10-12 MED ORDER — METRONIDAZOLE 500 MG PO TABS: 500.0000 mg | ORAL_TABLET | Freq: Two times a day (BID) | ORAL | 0 refills | Status: DC

## 2020-10-12 NOTE — Progress Notes (Signed)
Pt c/o possible BV sx's Rx sent per protocol  Pt voiced understanding.

## 2020-12-21 ENCOUNTER — Emergency Department (HOSPITAL_BASED_OUTPATIENT_CLINIC_OR_DEPARTMENT_OTHER): Payer: Medicaid Other

## 2020-12-21 ENCOUNTER — Emergency Department (HOSPITAL_BASED_OUTPATIENT_CLINIC_OR_DEPARTMENT_OTHER)
Admission: EM | Admit: 2020-12-21 | Discharge: 2020-12-21 | Disposition: A | Discharge status: Home / Self Care | Payer: Medicaid Other | Attending: Emergency Medicine | Admitting: Emergency Medicine

## 2020-12-21 ENCOUNTER — Other Ambulatory Visit: Payer: Self-pay

## 2020-12-21 ENCOUNTER — Encounter (HOSPITAL_BASED_OUTPATIENT_CLINIC_OR_DEPARTMENT_OTHER): Payer: Self-pay

## 2020-12-21 DIAGNOSIS — F17210 Nicotine dependence, cigarettes, uncomplicated: Secondary | ICD-10-CM | POA: Diagnosis not present

## 2020-12-21 DIAGNOSIS — J45909 Unspecified asthma, uncomplicated: Secondary | ICD-10-CM | POA: Diagnosis not present

## 2020-12-21 DIAGNOSIS — S99921A Unspecified injury of right foot, initial encounter: Secondary | ICD-10-CM | POA: Diagnosis not present

## 2020-12-21 DIAGNOSIS — X58XXXA Exposure to other specified factors, initial encounter: Secondary | ICD-10-CM | POA: Insufficient documentation

## 2020-12-21 MED ORDER — LIDOCAINE HCL 2 % IJ SOLN
10.0000 mL | Freq: Once | INTRAMUSCULAR | Status: AC
  Administered 2020-12-21: 200 mg
  Filled 2020-12-21: qty 20

## 2020-12-21 MED ORDER — OXYCODONE-ACETAMINOPHEN 5-325 MG PO TABS
1.0000 | ORAL_TABLET | Freq: Once | ORAL | Status: AC
  Administered 2020-12-21: 1 via ORAL
  Filled 2020-12-21: qty 1

## 2020-12-21 NOTE — Discharge Instructions (Addendum)
You came to the emergency department today to be evaluated for your right big toe injury.  Your physical exam was reassuring.  The x-ray imaging showed no broken bones or dislocation.  While attempting to remove your artificial nail your biological nail required removal.  Please see attached paperwork for proper care.  Get help right away if: You suddenly develop severe pain in your foot. You previously had sensation in your foot and you suddenly lose sensation. Your symptoms had improved and they suddenly get worse. Your foot or toes are turning pink or blue.

## 2020-12-21 NOTE — ED Provider Notes (Signed)
[facility] EMERGENCY DEPT Provider Note   CSN: [id number] Arrival date & time: 12/21/20  1554     History Chief Complaint  Patient presents with   Toe Pain    Briana Wagner is a 30 y.o. female presents to the emergency department with chief complaint of right great toe pain.  Patient states that 3 days prior she stubbed her toe.  Patient has had constant pain since then.  Patient rates pain 8/10 on the pain scale.  Patient describes pain as "throbbing and sharp."  Patient states that pain is worse with touch or movement.  Patient has tried over-the-counter pain medication with some relief of symptoms.  Additionally patient reports noting bruising after initial injury.  After that patient noticed serosanguineous discharge coming from under her nail.   Toe Pain      Past Medical History:  Diagnosis Date   Asthma    last used inhaler 72mo ago   Bronchitis    BV (bacterial vaginosis)    Chlamydia     Patient Active Problem List   Diagnosis Date Noted   Encounter for routine checking of intrauterine contraceptive device (IUD) 10/02/2020   Presence of intrauterine contraceptive device (IUD) 10/02/2020   Vaginal discharge 10/02/2020   GBS (group B Streptococcus carrier), +RV culture, currently pregnant 01/02/2020   History of preterm delivery, currently pregnant 08/11/2019   History of placental abruption 08/11/2019   Vaginal delivery 04/14/2014    Past Surgical History:  Procedure Laterality Date   abortion     x2   WISDOM TOOTH EXTRACTION  2014   WISDOM TOOTH EXTRACTION       OB History     Gravida  4   Para  2   Term  1   Preterm  1   AB  2   Living  2      SAB      IAB  2   Ectopic      Multiple  0   Live Births  2           Family History  Problem Relation Age of Onset   Hypertension Mother    Aneurysm Mother    Stroke Mother    Heart disease Father        3 MIs, stints   Hypertension Father    COPD Father      Social History   Tobacco Use   Smoking status: Every Day    Packs/day: 0.25    Years: 9.00    Pack years: 2.25    Types: Cigarettes   Smokeless tobacco: Never  Vaping Use   Vaping Use: Former  Substance Use Topics   Alcohol use: No   Drug use: No    Home Medications Prior to Admission medications   Medication Sig Start Date End Date Taking? Authorizing Provider  albuterol (VENTOLIN HFA) 108 (90 Base) MCG/ACT inhaler Inhale 2 puffs into the lungs every 4 (four) hours as needed for wheezing or shortness of breath. Patient not taking: Reported on 03/21/2020 09/04/19   Judeth Horn, NP  metroNIDAZOLE (FLAGYL) 500 MG tablet Take 1 tablet (500 mg total) by mouth 2 (two) times daily. 03/21/20   Brock Bad, MD  metroNIDAZOLE (FLAGYL) 500 MG tablet Take 1 tablet (500 mg total) by mouth 2 (two) times daily. 10/12/20   Brock Bad, MD  Prenatal Vit-Iron Carbonyl-FA (PRENATAL PLUS IRON) 29-1 MG TABS Take 1 tablet by mouth every morning. 03/21/20   Clearance Coots,  Bing Neighbors, MD    Allergies    Shellfish allergy  Review of Systems   Review of Systems  Musculoskeletal:  Positive for arthralgias and myalgias.  Skin:  Negative for color change, pallor, rash and wound.  Neurological:  Negative for weakness and numbness.   Physical Exam Updated Vital Signs BP (!) 120/92   Pulse 76   Temp 98.2 F (36.8 C) (Oral)   Resp 16   SpO2 99%   Physical Exam Vitals and nursing note reviewed.  Constitutional:      General: She is not in acute distress.    Appearance: She is not ill-appearing, toxic-appearing or diaphoretic.  HENT:     Head: Normocephalic.  Eyes:     General: No scleral icterus.       Right eye: No discharge.        Left eye: No discharge.  Cardiovascular:     Rate and Rhythm: Normal rate.     Pulses:          Dorsalis pedis pulses are 2+ on the right side and 2+ on the left side.  Pulmonary:     Effort: Pulmonary effort is normal.  Feet:     Right foot:      Skin integrity: Skin integrity normal.     Left foot:     Skin integrity: Skin integrity normal.     Toenail Condition: Left toenails are normal.     Comments: Patient has dried blood around right great toenail.  Right great toe is tender to touch.  No swelling or erythema noted to affected digit.  Sensation intact to all aspects of affected digit.  Cap refill less than 2 seconds in 2nd-5th right toes.  He has acrylic nails applied to all digits. Skin:    General: Skin is warm and dry.  Neurological:     General: No focal deficit present.     Mental Status: She is alert.     GCS: GCS eye subscore is 4. GCS verbal subscore is 5. GCS motor subscore is 6.  Psychiatric:        Behavior: Behavior is cooperative.    ED Results / Procedures / Treatments   Labs (all labs ordered are listed, but only abnormal results are displayed) Labs Reviewed - No data to display  EKG None  Radiology No results found.  Procedures .Nail Removal  Date/Time: 12/22/2020 12:07 AM Performed by: Haskel Schroeder, PA-C Authorized by: Haskel Schroeder, PA-C   Consent:    Consent obtained:  Verbal   Consent given by:  Patient   Risks discussed:  Bleeding, incomplete removal, infection, pain and permanent nail deformity   Alternatives discussed:  No treatment Universal protocol:    Procedure explained and questions answered to patient or proxy's satisfaction: yes     Patient identity confirmed:  Verbally with patient Location:    Foot:  R big toe Pre-procedure details:    Skin preparation:  Chlorhexidine Anesthesia:    Anesthesia method:  Nerve block   Block needle gauge:  24 G   Block anesthetic:  Lidocaine 2% w/o epi   Block injection procedure:  Anatomic landmarks identified, anatomic landmarks palpated, introduced needle, negative aspiration for blood and incremental injection   Block outcome:  Anesthesia achieved Nail Removal:    Nail removed:  Complete   Nail bed repaired: no      Removed nail replaced and anchored: no   Post-procedure details:    Dressing:  Xeroform gauze  and 4x4 sterile gauze   Medications Ordered in ED Medications - No data to display  ED Course  I have reviewed the triage vital signs and the nursing notes.  Pertinent labs & imaging results that were available during my care of the patient were reviewed by me and considered in my medical decision making (see chart for details).    MDM Rules/Calculators/A&P                           Alert 30 year old female no acute distress, nontoxic-appearing.  Patient presents with chief plaint of right great toe injury.  X-ray imaging obtained of right great toe shows no acute fracture or dislocation.  Due to patient's report of bruising and serosanguineous discharge concern for possible subungual hematoma.  Unable to fully assess for subungual hematoma due to patient's acrylic nails.  We will plan to perform digital block and remove acrylic nail.  While attempting to remove acrylic nail patient's right great toenail is noted to easily come loose from nail bed.  Discussed with patient nail removal versus leaving nail intact.  Patient elects for nail removal at this time.  Right great toenail removed as noted above.  Discussed results, findings, treatment and follow up. Patient advised of return precautions. Patient verbalized understanding and agreed with plan.   Final Clinical Impression(s) / ED Diagnoses Final diagnoses:  Injury of toe on right foot, initial encounter    Rx / DC Orders ED Discharge Orders     None        Haskel Schroeder, PA-C 12/22/20 0013    Jacalyn Lefevre, MD 12/24/20 1600

## 2020-12-21 NOTE — ED Triage Notes (Signed)
Pt presents with pain to her Right great toe, jammed her toe and needs her foot numb to remove the artificial nail

## 2020-12-21 NOTE — ED Notes (Addendum)
Pt toe wrapped with non-adherent dressing and bacitracin. Pt instructed on wound care and provided with Go Bag to take home.

## 2021-06-27 IMAGING — US US MFM OB FOLLOW-UP
1 series · 13 of 28 positions shown · non-contrast
Comparison: none

[Series 1: us mfm ob follow-up · 59 acquisitions, 13 frames shown]
[im 3/59]
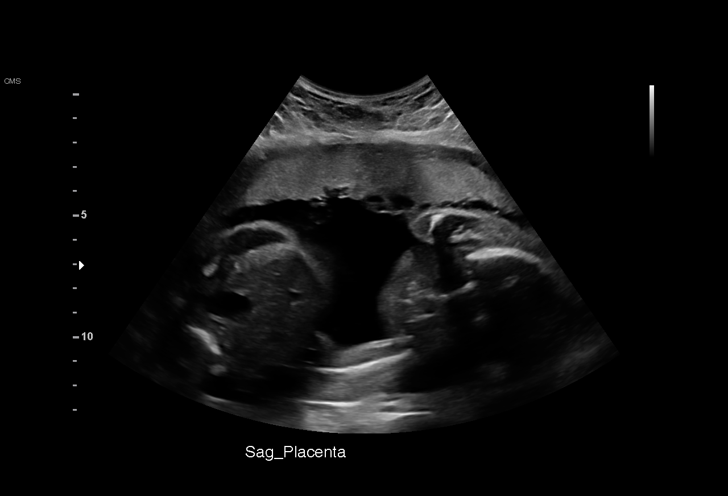
[im 7/59]
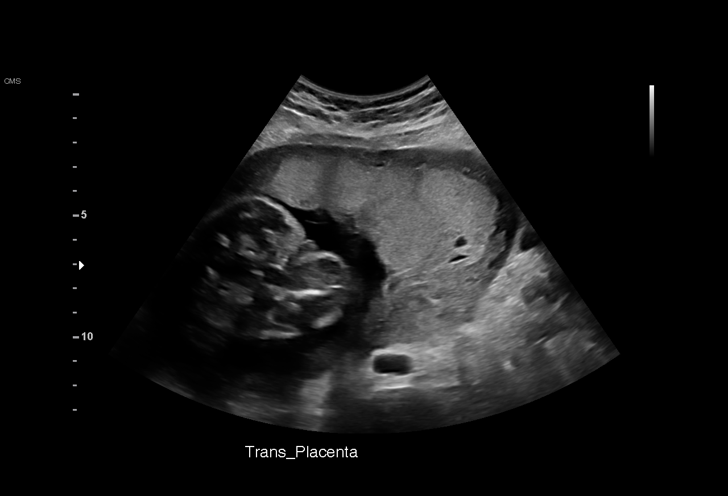
[im 11/59]
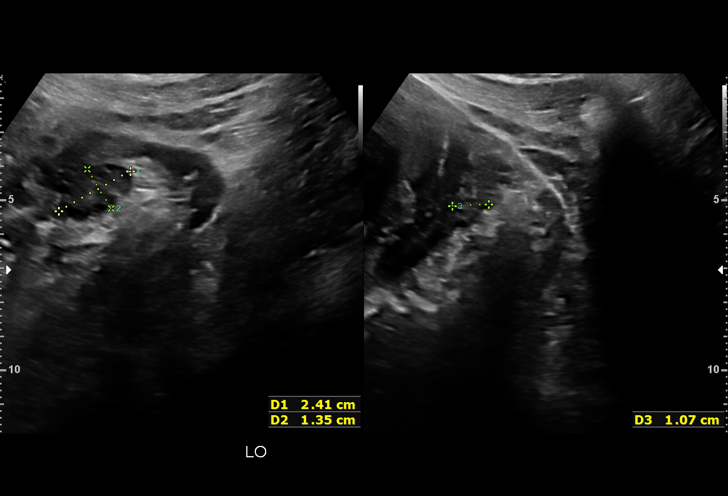
[im 16/59]
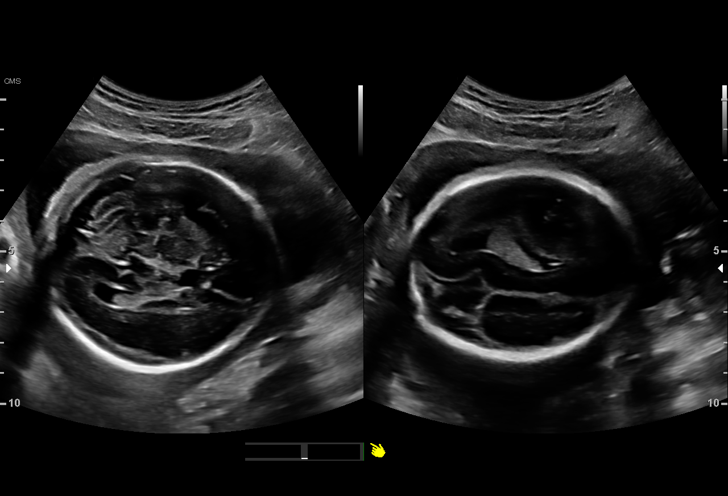
[im 20/59]
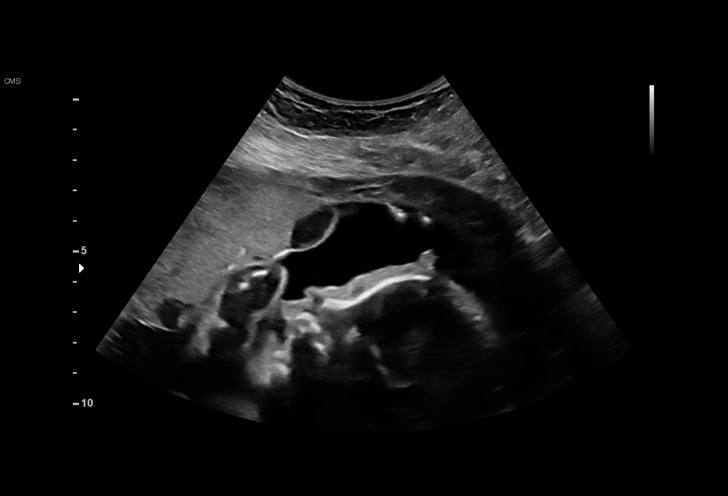
[im 24/59]
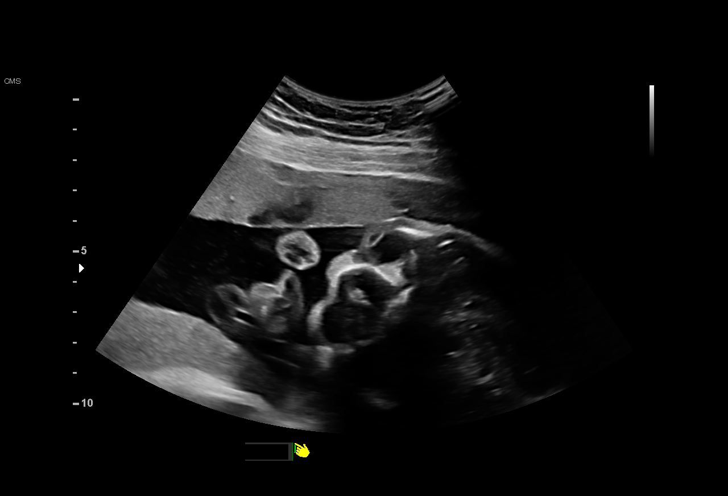
[im 31/59]
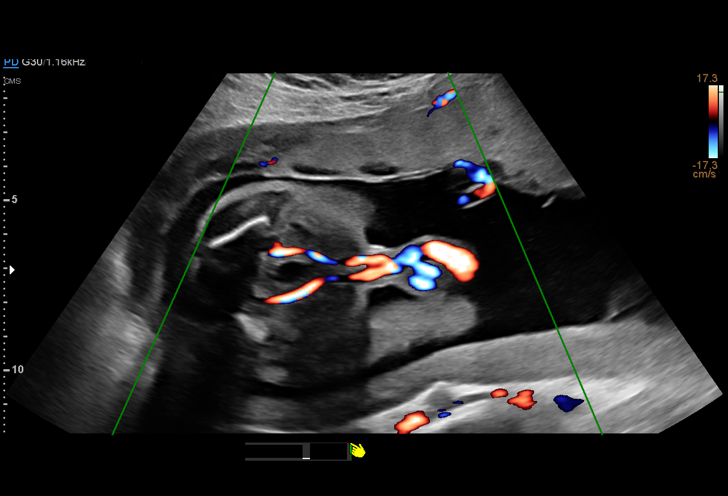
[im 35/59]
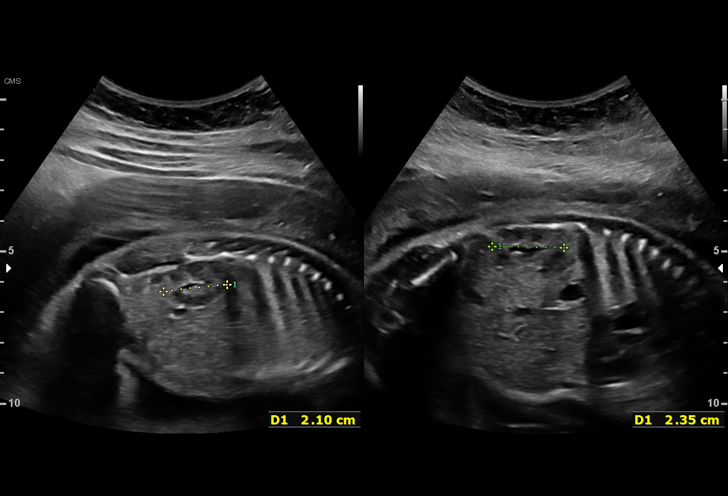
[im 39/59]
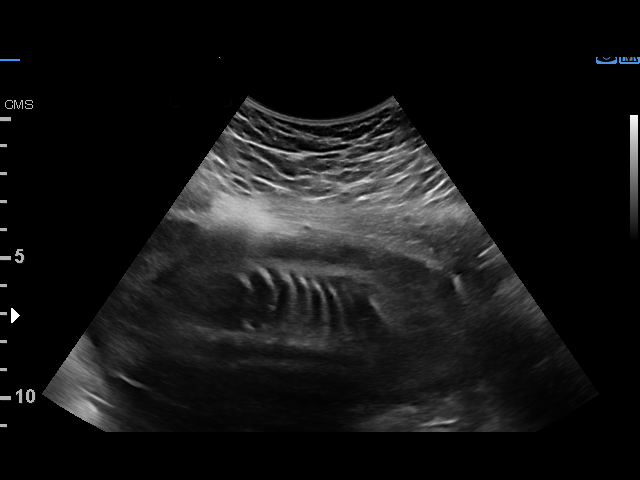
[im 43/59]
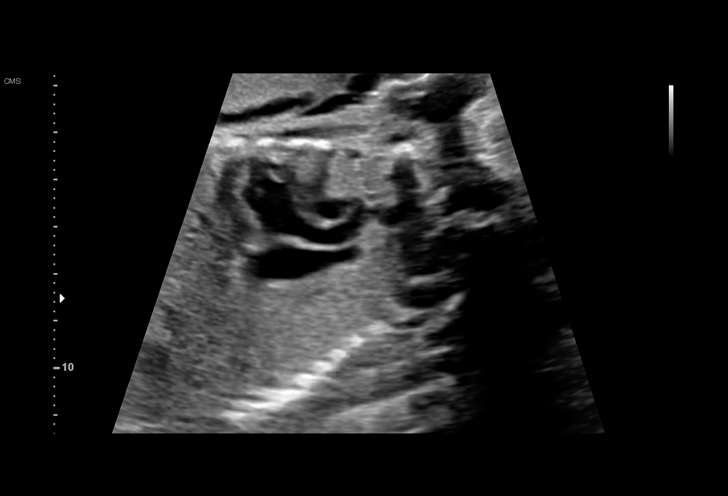
[im 48/59]
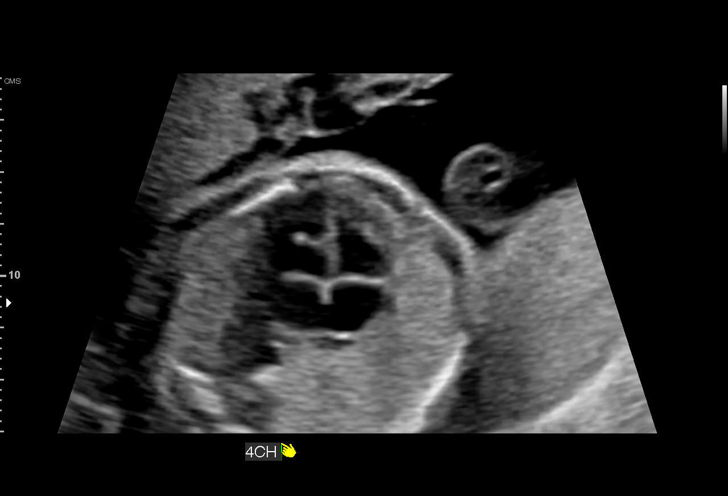
[im 52/59]
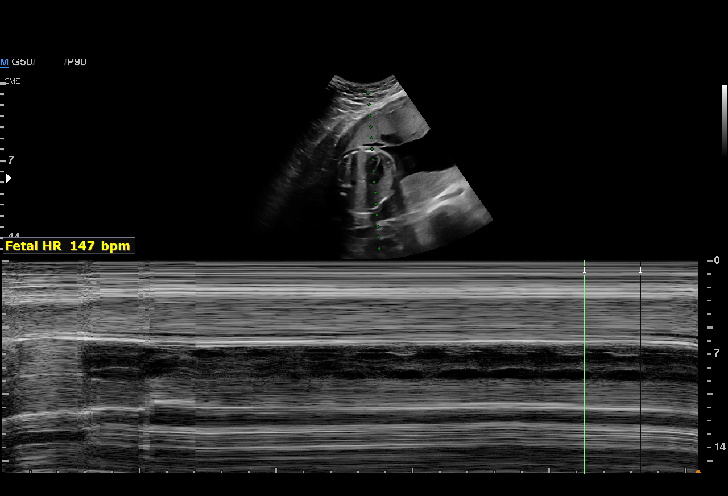
[im 56/59]
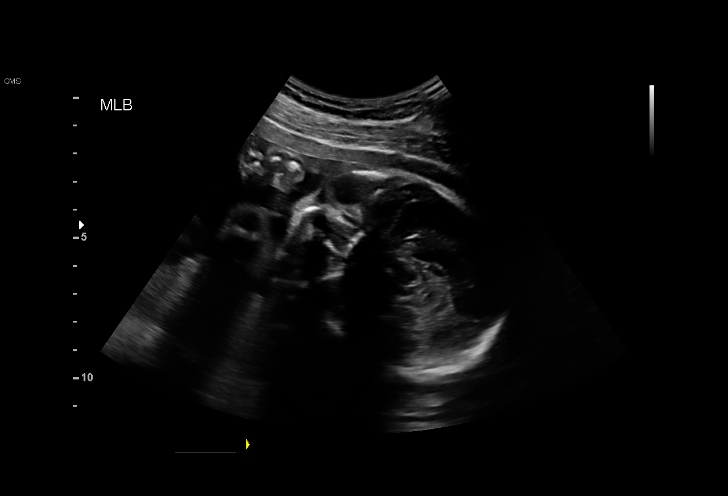

[13 of 28 positions shown; findings below may reference images not displayed]

Indications

 Prior poor obstetrical history antepartum,
 second trimester (placental abruption)
 Encounter for other antenatal screening
 follow-up
 Poor obstetric history (prior pre-term labor)
 Subchorionic hemorrhage, antepartum
 Poor obstetric history: Previous preterm
 delivery, antepartum (33 weeks) 17 P
 26 weeks gestation of pregnancy
Fetal Evaluation

 Num Of Fetuses:         1
 Fetal Heart Rate(bpm):  147
 Cardiac Activity:       Observed
 Presentation:           Cephalic
 Placenta:               Anterior
 P. Cord Insertion:      Visualized

 Amniotic Fluid
 AFI FV:      Within normal limits

                             Largest Pocket(cm)

Biometry
 BPD:      68.2  mm     G. Age:  27w 3d         59  %    CI:        75.23   %    70 - 86
                                                         FL/HC:      19.2   %    18.6 -
 HC:      249.4  mm     G. Age:  27w 1d         29  %    HC/AC:      1.14        1.05 -
 AC:      219.6  mm     G. Age:  26w 3d         28  %    FL/BPD:     70.4   %    71 - 87
 FL:         48  mm     G. Age:  26w 1d         15  %    FL/AC:      21.9   %    20 - 24
 HUM:      42.2  mm     G. Age:  25w 3d         10  %

 Est. FW:     933  gm      2 lb 1 oz     22  %
OB History

 Gravidity:    4         Term:   0        Prem:   1        SAB:   0
 TOP:          2       Ectopic:  0        Living: 1
Gestational Age

 LMP:           27w 3d        Date:  04/15/19                 EDD:   01/20/20
 U/S Today:     26w 6d                                        EDD:   01/24/20
 Best:          26w 6d     Det. By:  Early Ultrasound         EDD:   01/24/20
                                     (05/29/19)
Anatomy

 Cranium:               Appears normal         Aortic Arch:            Appears normal
 Cavum:                 Appears normal         Ductal Arch:            Previously seen
 Ventricles:            Appears normal         Diaphragm:              Appears normal
 Choroid Plexus:        Appears normal         Stomach:                Appears normal, left
                                                                       sided
 Cerebellum:            Appears normal         Abdomen:                Appears normal
 Posterior Fossa:       Previously seen        Abdominal Wall:         Appears nml (cord
                                                                       insert, abd wall)
 Nuchal Fold:           Not applicable (>20    Cord Vessels:           Appears normal (3
                        wks GA)                                        vessel cord)
 Face:                  Appears normal         Kidneys:                Appear normal
                        (orbits and profile)
 Lips:                  Appears normal         Bladder:                Appears normal
 Thoracic:              Appears normal         Spine:                  Previously seen
 Heart:                 Echogenic focus        Upper Extremities:      Previously seen
                        in LV
 RVOT:                  Appears normal         Lower Extremities:      Previously seen
 LVOT:                  Appears normal

 Other:  Male gender. Heels and 5th digit previously visualized. Open hands
         previously visualized. Nasal bone visualized.
Cervix Uterus Adnexa

 Cervix
 Not visualized (advanced GA >03wks)

 Uterus
 No abnormality visualized.

 Right Ovary
 Within normal limits.
 Left Ovary
 Within normal limits.

 Cul De Sac
 No free fluid seen.

 Adnexa
 No abnormality visualized.
Comments

 This patient was seen for a follow up growth scan. She
 denies any problems since her last exam.
 The fetal growth and amniotic fluid level appears appropriate
 for her gestational age.
 As the fetal growth is within normal limits, no further exams
 were scheduled in our office.

## 2021-07-07 ENCOUNTER — Encounter (HOSPITAL_COMMUNITY): Payer: Self-pay

## 2021-07-07 ENCOUNTER — Other Ambulatory Visit: Payer: Self-pay

## 2021-07-07 ENCOUNTER — Emergency Department (HOSPITAL_COMMUNITY)
Admission: EM | Admit: 2021-07-07 | Discharge: 2021-07-07 | Disposition: A | Discharge status: Home / Self Care | Payer: Medicaid Other | Attending: Emergency Medicine | Admitting: Emergency Medicine

## 2021-07-07 DIAGNOSIS — S91311A Laceration without foreign body, right foot, initial encounter: Secondary | ICD-10-CM | POA: Diagnosis not present

## 2021-07-07 DIAGNOSIS — W208XXA Other cause of strike by thrown, projected or falling object, initial encounter: Secondary | ICD-10-CM | POA: Diagnosis not present

## 2021-07-07 DIAGNOSIS — S99921A Unspecified injury of right foot, initial encounter: Secondary | ICD-10-CM | POA: Diagnosis present

## 2021-07-07 NOTE — ED Provider Notes (Signed)
?[place] DEPT ?[facility] Emergency Department ?Provider Note ?MRN:  [id number]  ?Arrival date & time: 07/07/21    ? ?Chief Complaint   ?Laceration ?  ?History of Present Illness   ?Briana Wagner is a 31 y.o. year-old female presents to the ED with chief complaint of laceration to the top of her right foot.  She states that she dropped some glass on it.  She denies pain with movement or lack of movement of the toe.  Denies any other injuries. ? ?History provided by patient. ? ? ?Review of Systems  ?Pertinent review of systems noted in HPI.  ? ? ?Physical Exam  ? ?Vitals:  ? 07/07/21 0227  ?BP: (!) 140/104  ?Pulse: (!) 108  ?Resp: 16  ?Temp: 98 ?F (36.7 ?C)  ?SpO2: 98%  ?  ?CONSTITUTIONAL:  intoxicated-appearing, NAD ?NEURO:  Alert and oriented x 3, CN 3-12 grossly intact ?EYES:  eyes equal and reactive ?ENT/NECK:  Supple, no stridor  ?CARDIO:  appears well-perfused  ?PULM:  No respiratory distress,  ?GI/GU:  non-distended,  ?MSK/SPINE:  No gross deformities, no edema, moves all extremities  ?SKIN:  no rash, 2cm laceration to right posterior foot over the 5th metacarpal, no palpable or visual FB, minor tendon injury, but majority of tendon is intact and there is full ROM and no deficit ? ? ?*Additional and/or pertinent findings included in MDM below ? ?Diagnostic and Interventional Summary  ? ? EKG Interpretation ? ?Date/Time:    ?Ventricular Rate:    ?PR Interval:    ?QRS Duration:   ?QT Interval:    ?QTC Calculation:   ?R Axis:     ?Text Interpretation:   ?  ? ?  ? ?Labs Reviewed - No data to display  ?No orders to display  ?  ?Medications - No data to display  ? ?Procedures  /  Critical Care ?Marland Kitchen.Laceration Repair ? ?Date/Time: 07/07/2021 3:10 AM ?Performed by: Montine Circle, PA-C ?Authorized by: Montine Circle, PA-C  ? ?Consent:  ?  Consent obtained:  Verbal ?  Consent given by:  Patient ?  Risks discussed:  Infection, need for additional repair, pain, poor cosmetic result and poor wound  healing ?  Alternatives discussed:  No treatment and delayed treatment ?Universal protocol:  ?  Procedure explained and questions answered to patient or proxy's satisfaction: yes   ?  Relevant documents present and verified: yes   ?  Test results available: yes   ?  Imaging studies available: yes   ?  Required blood products, implants, devices, and special equipment available: yes   ?  Site/side marked: yes   ?  Immediately prior to procedure, a time out was called: yes   ?  Patient identity confirmed:  Verbally with patient ?Anesthesia:  ?  Anesthesia method:  Local infiltration ?  Local anesthetic:  Lidocaine 1% WITH epi ?Laceration details:  ?  Location:  Foot ?  Foot location:  Top of R foot ?  Length (cm):  2 ?Exploration:  ?  Wound exploration: wound explored through full range of motion and entire depth of wound visualized   ?  Wound extent: tendon damage   ?  Tendon damage extent:  Partial transection ?Treatment:  ?  Irrigation solution:  Sterile water ?Skin repair:  ?  Repair method:  Sutures ?  Suture size:  4-0 ?  Suture material:  Prolene ?  Suture technique:  Simple interrupted ?  Number of sutures:  3 ?Approximation:  ?  Approximation:  Close ?Repair type:  ?  Repair type:  Simple ? ?ED Course and Medical Decision Making  ?I have reviewed the triage vital signs, the nursing notes, and pertinent available records from the EMR. ? ?Social Determinants Affecting Complexity of Care: ?Patient has no clinically significant social determinants affecting this chief complaint.. ? ? ?ED Course: ?  ?Patient here with foot laceration. ?Medical Decision Making ?Problems Addressed: ?Laceration of right foot, initial encounter: acute illness or injury ? ?  ? ?Consultants: ?No consultations were needed in caring for this patient. ? ? ?Treatment and Plan: ? ? ?Emergency department workup does not suggest an emergent condition requiring admission or immediate intervention beyond  what has been performed at this time. The  patient is safe for discharge and has  been instructed to return immediately for worsening symptoms, change in  symptoms or any other concerns ? ? ? ?Final Clinical Impressions(s) / ED Diagnoses  ? ?  ICD-10-CM   ?1. Laceration of right foot, initial encounter  S91.311A   ?  ?  ?ED Discharge Orders   ? ? None  ? ?  ?  ? ? ?Discharge Instructions Discussed with and Provided to Patient:  ? ? ?Discharge Instructions   ? ?  ?The sutures need to be removed in 14 days.  Keep the wound clean and dry.   ? ? ? ?  ?Montine Circle, PA-C ?07/07/21 [id number] ? ?  ?Quintella Reichert, MD ?07/07/21 [phone] ? ?

## 2021-07-07 NOTE — ED Triage Notes (Signed)
Pt has a laceration to the top of her right foot. Pt states that she was cut by glass.  ?

## 2021-07-07 NOTE — Discharge Instructions (Addendum)
The sutures need to be removed in 14 days.  Keep the wound clean and dry.   ?

## 2021-07-31 ENCOUNTER — Emergency Department (HOSPITAL_BASED_OUTPATIENT_CLINIC_OR_DEPARTMENT_OTHER)
Admission: EM | Admit: 2021-07-31 | Discharge: 2021-07-31 | Disposition: A | Discharge status: Home / Self Care | Payer: Medicaid Other | Attending: Emergency Medicine | Admitting: Emergency Medicine

## 2021-07-31 ENCOUNTER — Other Ambulatory Visit: Payer: Self-pay

## 2021-07-31 ENCOUNTER — Encounter (HOSPITAL_BASED_OUTPATIENT_CLINIC_OR_DEPARTMENT_OTHER): Payer: Self-pay

## 2021-07-31 DIAGNOSIS — S91311D Laceration without foreign body, right foot, subsequent encounter: Secondary | ICD-10-CM | POA: Insufficient documentation

## 2021-07-31 DIAGNOSIS — X58XXXD Exposure to other specified factors, subsequent encounter: Secondary | ICD-10-CM | POA: Diagnosis not present

## 2021-07-31 DIAGNOSIS — Z4802 Encounter for removal of sutures: Secondary | ICD-10-CM | POA: Diagnosis present

## 2021-07-31 NOTE — ED Triage Notes (Signed)
Pt is here to have sutures removed from her right foot

## 2021-07-31 NOTE — Discharge Instructions (Signed)
Continue to apply Vaseline and/or antibiotic ointments to the wound 1-2 times per day for the next few days as it continues to heal.  You may follow-up with primary care as needed for reevaluation and continued medical management.  Return to the ED for new or worsening symptoms as discussed.

## 2021-07-31 NOTE — ED Provider Notes (Signed)
[facility] EMERGENCY DEPT Provider Note   CSN: [id number] Arrival date & time: 07/31/21  1705     History  Chief Complaint  Patient presents with   Suture / Staple Removal    Briana Wagner is a 31 y.o. female presenting today for suture removal of a healing wound on the right foot.  Denies warmth, redness, or drainage surrounding or coming from the wound.  Denies fever, chest pain, shortness of breath, abdominal pain.  The history is provided by the patient and medical records.  Suture / Staple Removal      Home Medications Prior to Admission medications   Medication Sig Start Date End Date Taking? Authorizing Provider  albuterol (VENTOLIN HFA) 108 (90 Base) MCG/ACT inhaler Inhale 2 puffs into the lungs every 4 (four) hours as needed for wheezing or shortness of breath. Patient not taking: Reported on 03/21/2020 09/04/19   Judeth Horn, NP  metroNIDAZOLE (FLAGYL) 500 MG tablet Take 1 tablet (500 mg total) by mouth 2 (two) times daily. 03/21/20   Brock Bad, MD  metroNIDAZOLE (FLAGYL) 500 MG tablet Take 1 tablet (500 mg total) by mouth 2 (two) times daily. 10/12/20   Brock Bad, MD  Prenatal Vit-Iron Carbonyl-FA (PRENATAL PLUS IRON) 29-1 MG TABS Take 1 tablet by mouth every morning. 03/21/20   Brock Bad, MD      Allergies    Shellfish allergy    Review of Systems   Review of Systems  Skin:        Suture removal   Physical Exam Updated Vital Signs BP 125/79 (BP Location: Right Arm)   Pulse 60   Temp 98.7 F (37.1 C) (Oral)   Resp 20   Ht 5' 3.5" (1.613 m)   SpO2 100%   BMI 26.85 kg/m  Physical Exam Vitals and nursing note reviewed.  Constitutional:      General: She is not in acute distress.    Appearance: She is well-developed.  HENT:     Head: Normocephalic and atraumatic.  Eyes:     Conjunctiva/sclera: Conjunctivae normal.  Cardiovascular:     Rate and Rhythm: Normal rate and regular rhythm.     Pulses: Normal pulses.      Heart sounds: No murmur heard. Pulmonary:     Effort: Pulmonary effort is normal. No respiratory distress.     Breath sounds: Normal breath sounds.  Abdominal:     Palpations: Abdomen is soft.     Tenderness: There is no abdominal tenderness.  Musculoskeletal:        General: No swelling.     Cervical back: Neck supple.  Skin:    General: Skin is warm and dry.     Capillary Refill: Capillary refill takes less than 2 seconds.     Findings: No erythema.     Comments: Wound of right foot appears closed, without surrounding erythema, redness, or active discharge.  No crepitus.  3 sutures visible on exam.  Neurological:     Mental Status: She is alert and oriented to person, place, and time.  Psychiatric:        Mood and Affect: Mood normal.    ED Results / Procedures / Treatments   Labs (all labs ordered are listed, but only abnormal results are displayed) Labs Reviewed - No data to display  EKG None  Radiology No results found.  Procedures .Suture Removal  Date/Time: 07/31/2021 7:10 PM Performed by: Cecil Cobbs, PA-C Authorized by: Cecil Cobbs, PA-C  Consent:    Consent obtained:  Verbal and written   Consent given by:  Patient   Risks, benefits, and alternatives were discussed: yes     Risks discussed:  Bleeding, pain and wound separation   Alternatives discussed:  Referral and no treatment Universal protocol:    Procedure explained and questions answered to patient or proxy's satisfaction: yes     Immediately prior to procedure, a time out was called: yes     Patient identity confirmed:  Verbally with patient and arm band Location:    Location:  Lower extremity   Lower extremity location:  Foot   Foot location:  R foot Procedure details:    Wound appearance:  No signs of infection, good wound healing and clean   Number of sutures removed:  3 Post-procedure details:    Post-removal:  Antibiotic ointment applied and dressing applied (Acewrap  applied)   Procedure completion:  Tolerated well, no immediate complications Comments:     Patient states her foot feels significantly better following suture removal.  No evidence of surrounding infection.  Wound appears to be healing well and appears clean.  Wound rebandaged.    Medications Ordered in ED Medications - No data to display  ED Course/ Medical Decision Making/ A&P                           Medical Decision Making Amount and/or Complexity of Data Reviewed External Data Reviewed: notes. Labs: ordered. Decision-making details documented in ED Course. Radiology: ordered and independent interpretation performed. Decision-making details documented in ED Course. ECG/medicine tests: ordered and independent interpretation performed. Decision-making details documented in ED Course.  Risk OTC drugs. Prescription drug management.   31 y.o. female presents to the ED for concern of Suture / Staple Removal   This involves an extensive number of treatment options, and is a complaint that carries with it a high risk of complications and morbidity.   Past Medical History / Co-morbidities / Social History: Hx of asthma and daily tobacco use.  Cessation counseling was provided for tobacco use.  Additional History:  Internal and external records from outside source obtained and reviewed including ED visits  Physical Exam: Physical exam performed. The pertinent findings include: 3 sutures in place and wound of right foot.  Appears to be healing well without evidence of infection.  Lab Tests: None  Imaging Studies: None  Medications: None  Procedures: Patient underwent suture removal, described above in further detail.   ED Course/Disposition: Pt well-appearing on exam.  Pt to ER for suture removal and wound check as above.  Procedure tolerated well.  Vitals normal, no signs of infection.  Scar minimization & return precautions given at dc.   After consideration of the  diagnostic results and the patient's encounter today, I feel that the emergency department workup does not suggest an emergent condition requiring admission or immediate intervention beyond what has been performed at this time.  The patient is safe for discharge and has been instructed to return immediately for worsening symptoms, change in symptoms or any other concerns.  Discussed course of treatment thoroughly with the patient, whom demonstrated understanding.  Patient in agreement and has no further questions.  I discussed this case with my attending physician Dr. Deretha Emory, who agreed with the proposed treatment course and cosigned this note including patient's presenting symptoms, physical exam, and planned diagnostics and interventions.  Attending physician stated agreement with plan or made changes to  plan which were implemented.     This chart was dictated using voice recognition software.  Despite best efforts to proofread, errors can occur which can change the documentation meaning.         Final Clinical Impression(s) / ED Diagnoses Final diagnoses:  Visit for suture removal    Rx / DC Orders ED Discharge Orders     None         Cecil CobbsCockerham, Misheel Gowans M, Cordelia Poche-C 07/31/21 Mila [id number]    Zackowski, Scott, MD 08/05/21 1101

## 2021-07-31 NOTE — ED Notes (Signed)
Bacitracin applied and foot was wrapped. Discharge instructions reviewed and explained, pt verbalized understanding and had no further questions on d/c. Pt caox4 and ambulatory on departure.

## 2021-11-27 ENCOUNTER — Other Ambulatory Visit: Payer: Self-pay

## 2021-11-27 ENCOUNTER — Encounter (HOSPITAL_BASED_OUTPATIENT_CLINIC_OR_DEPARTMENT_OTHER): Payer: Self-pay | Admitting: Emergency Medicine

## 2021-11-27 ENCOUNTER — Emergency Department (HOSPITAL_BASED_OUTPATIENT_CLINIC_OR_DEPARTMENT_OTHER)
Admission: EM | Admit: 2021-11-27 | Discharge: 2021-11-28 | Disposition: A | Discharge status: Home / Self Care | Payer: Medicaid Other | Attending: Emergency Medicine | Admitting: Emergency Medicine

## 2021-11-27 DIAGNOSIS — S0003XA Contusion of scalp, initial encounter: Secondary | ICD-10-CM | POA: Diagnosis not present

## 2021-11-27 DIAGNOSIS — S0990XA Unspecified injury of head, initial encounter: Secondary | ICD-10-CM | POA: Diagnosis present

## 2021-11-27 DIAGNOSIS — F0781 Postconcussional syndrome: Secondary | ICD-10-CM

## 2021-11-27 DIAGNOSIS — W228XXA Striking against or struck by other objects, initial encounter: Secondary | ICD-10-CM | POA: Insufficient documentation

## 2021-11-27 DIAGNOSIS — J45909 Unspecified asthma, uncomplicated: Secondary | ICD-10-CM | POA: Insufficient documentation

## 2021-11-27 DIAGNOSIS — F17210 Nicotine dependence, cigarettes, uncomplicated: Secondary | ICD-10-CM | POA: Insufficient documentation
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
## 2021-11-28 MED ORDER — ONDANSETRON 8 MG PO TBDP: 8.0000 mg | ORAL_TABLET | Freq: Three times a day (TID) | ORAL | 0 refills | Status: AC | PRN
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
## 2021-12-02 ENCOUNTER — Encounter: Payer: Self-pay | Admitting: Family Medicine

## 2021-12-02 ENCOUNTER — Ambulatory Visit: Payer: Medicaid Other | Admitting: Family Medicine

## 2021-12-02 VITALS — BP 120/82 | Ht 63.5 in | Wt 143.0 lb

## 2021-12-02 DIAGNOSIS — S060X0A Concussion without loss of consciousness, initial encounter: Secondary | ICD-10-CM | POA: Insufficient documentation

## 2021-12-02 MED ORDER — TRAZODONE HCL 50 MG PO TABS: 50.0000 mg | ORAL_TABLET | Freq: Every day | ORAL | 1 refills | Status: AC
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
## 2021-12-02 NOTE — Patient Instructions (Signed)
Nice to meet you Please try the exercises  I have made a referral to physical therapy   Please send me a message in MyChart with any questions or updates.  Please see me back in 1-2 weeks.   --Dr. Raeford Razor  Sleep is an integral part of our bodies ability to recover from our daily activities and is key to making you body perform at its maximal potential.  Establishing and maintaining a healthy sleep pattern can not only make you feel better it can help many chronic illnesses including high blood pressure, high cholesterol, obesity, chronic pain syndromes, and many others.  Some key things to remember regarding sleep are: Establish a consistent nightly routine that you do each night before bed. Avoid caffeine, tobacco and alcohol as all of these drugs will cause sleep disturbances.   Establish an exercise routine.  This can be as simple as walking, dancing or what every you find gets your heart rate elevated to the point you can speak in only 3-4 word sentences.  Exercise will help make falling and staying asleep easier.   If you have difficulty with sleep, reserve the bedroom for sleeping; do not watch TV, read, eat or exercise in your bed room.      - Watching TV in bed can trick your brain into thinking it is day time and will reset your internal clock.  If you are going to watch TV before bed do so outside of the bedroom.  Although it is best to avoid screens for the hour prior to bed that is difficult to do in our current technology driven society - at the very least it is imperative that you remove TV from the bed room.      - If you have a hard time falling asleep avoid showering and exercising prior to bed.

## 2021-12-06 ENCOUNTER — Encounter: Payer: Self-pay | Admitting: Physical Therapy

## 2021-12-06 ENCOUNTER — Ambulatory Visit: Payer: Medicaid Other | Attending: Family Medicine | Admitting: Physical Therapy

## 2021-12-06 DIAGNOSIS — G44319 Acute post-traumatic headache, not intractable: Secondary | ICD-10-CM | POA: Insufficient documentation

## 2021-12-06 DIAGNOSIS — R42 Dizziness and giddiness: Secondary | ICD-10-CM | POA: Insufficient documentation

## 2021-12-06 DIAGNOSIS — S060X0A Concussion without loss of consciousness, initial encounter: Secondary | ICD-10-CM | POA: Diagnosis present
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
## 2021-12-10 ENCOUNTER — Ambulatory Visit: Payer: Medicaid Other | Admitting: Physical Therapy

## 2021-12-11 ENCOUNTER — Ambulatory Visit: Payer: Medicaid Other | Admitting: Physical Therapy

## 2021-12-13 ENCOUNTER — Ambulatory Visit: Payer: Medicaid Other | Admitting: Physical Therapy

## 2021-12-16 ENCOUNTER — Ambulatory Visit: Payer: Medicaid Other | Admitting: Family Medicine

## 2021-12-18 ENCOUNTER — Ambulatory Visit: Payer: Medicaid Other | Admitting: Physical Therapy

## 2021-12-20 ENCOUNTER — Ambulatory Visit: Payer: Medicaid Other | Admitting: Physical Therapy

## 2021-12-25 ENCOUNTER — Ambulatory Visit: Payer: Medicaid Other | Admitting: Physical Therapy

## 2021-12-27 ENCOUNTER — Ambulatory Visit: Payer: Medicaid Other | Admitting: Physical Therapy

## 2022-01-01 ENCOUNTER — Ambulatory Visit: Payer: Medicaid Other | Admitting: Physical Therapy

## 2022-01-03 ENCOUNTER — Ambulatory Visit: Payer: Medicaid Other | Admitting: Physical Therapy
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
## 2022-06-10 ENCOUNTER — Encounter: Payer: Self-pay | Admitting: *Deleted

## 2022-06-12 ENCOUNTER — Ambulatory Visit: Payer: Commercial Managed Care - HMO | Attending: Internal Medicine
